# Patient Record
Sex: Female | Born: 1985 | Race: White | Hispanic: No | Marital: Single | Smoking: Former smoker
Health system: Southern US, Community
[De-identification: ages and names within clinical notes are randomized; demographics above are authoritative.]

## PROBLEM LIST (undated history)

## (undated) DIAGNOSIS — F32A Depression, unspecified: Secondary | ICD-10-CM

## (undated) DIAGNOSIS — J45909 Unspecified asthma, uncomplicated: Secondary | ICD-10-CM

## (undated) DIAGNOSIS — F419 Anxiety disorder, unspecified: Secondary | ICD-10-CM

## (undated) DIAGNOSIS — D500 Iron deficiency anemia secondary to blood loss (chronic): Secondary | ICD-10-CM

## (undated) DIAGNOSIS — G2581 Restless legs syndrome: Secondary | ICD-10-CM

## (undated) DIAGNOSIS — R519 Headache, unspecified: Secondary | ICD-10-CM

## (undated) DIAGNOSIS — N920 Excessive and frequent menstruation with regular cycle: Secondary | ICD-10-CM

## (undated) HISTORY — PX: APPENDECTOMY: SHX54

## (undated) HISTORY — DX: Unspecified asthma, uncomplicated: J45.909

## (undated) HISTORY — DX: Anxiety disorder, unspecified: F41.9

## (undated) HISTORY — PX: TUBAL LIGATION: SHX77

---

## 2010-02-14 ENCOUNTER — Encounter: Payer: Self-pay | Admitting: Maternal & Fetal Medicine

## 2010-07-15 ENCOUNTER — Observation Stay: Payer: Self-pay | Admitting: Obstetrics and Gynecology

## 2010-07-23 ENCOUNTER — Emergency Department: Payer: Self-pay | Admitting: Emergency Medicine

## 2010-08-08 ENCOUNTER — Inpatient Hospital Stay: Payer: Self-pay | Admitting: Obstetrics and Gynecology

## 2010-10-06 ENCOUNTER — Emergency Department: Payer: Self-pay | Admitting: Emergency Medicine

## 2010-11-04 ENCOUNTER — Emergency Department: Payer: Self-pay | Admitting: Emergency Medicine

## 2011-06-07 ENCOUNTER — Emergency Department: Payer: Self-pay | Admitting: Emergency Medicine

## 2012-07-20 ENCOUNTER — Emergency Department: Payer: Self-pay | Admitting: Emergency Medicine

## 2012-09-22 ENCOUNTER — Emergency Department: Payer: Self-pay | Admitting: Emergency Medicine

## 2012-09-22 LAB — URINALYSIS, COMPLETE
Ketone: NEGATIVE
Nitrite: NEGATIVE
Ph: 7 (ref 4.5–8.0)
Protein: NEGATIVE
Specific Gravity: 1.005 (ref 1.003–1.030)
WBC UR: 44 /HPF (ref 0–5)

## 2013-01-26 ENCOUNTER — Inpatient Hospital Stay: Payer: Self-pay | Admitting: Obstetrics and Gynecology

## 2013-01-26 LAB — CBC WITH DIFFERENTIAL/PLATELET
Basophil #: 0.1 10*3/uL (ref 0.0–0.1)
Basophil %: 0.7 %
Eosinophil #: 0 10*3/uL (ref 0.0–0.7)
Eosinophil %: 0.2 %
HGB: 12.1 g/dL (ref 12.0–16.0)
Lymphocyte %: 12.5 %
Monocyte #: 0.4 x10 3/mm (ref 0.2–0.9)
Neutrophil #: 8.1 10*3/uL — ABNORMAL HIGH (ref 1.4–6.5)
Platelet: 246 10*3/uL (ref 150–440)
RBC: 4.68 10*6/uL (ref 3.80–5.20)
RDW: 15.1 % — ABNORMAL HIGH (ref 11.5–14.5)
WBC: 9.9 10*3/uL (ref 3.6–11.0)

## 2013-01-26 LAB — COMPREHENSIVE METABOLIC PANEL
Albumin: 3.6 g/dL (ref 3.4–5.0)
Anion Gap: 9 (ref 7–16)
Bilirubin,Total: 0.7 mg/dL (ref 0.2–1.0)
Calcium, Total: 8.4 mg/dL — ABNORMAL LOW (ref 8.5–10.1)
Creatinine: 0.66 mg/dL (ref 0.60–1.30)
EGFR (Non-African Amer.): >60
Glucose: 187 mg/dL — ABNORMAL HIGH (ref 65–99)
Potassium: 3.9 mmol/L (ref 3.5–5.1)
SGOT(AST): 15 U/L (ref 15–37)
SGPT (ALT): 14 U/L (ref 12–78)
Sodium: 133 mmol/L — ABNORMAL LOW (ref 136–145)

## 2013-01-26 LAB — TSH: Thyroid Stimulating Horm: 0.652 u[IU]/mL

## 2013-01-26 LAB — URINALYSIS, COMPLETE
Bilirubin,UR: NEGATIVE
Nitrite: NEGATIVE
Protein: NEGATIVE
Specific Gravity: 1.027 (ref 1.003–1.030)
Squamous Epithelial: 11

## 2013-01-27 LAB — BASIC METABOLIC PANEL
Anion Gap: 3 — ABNORMAL LOW (ref 7–16)
BUN: 5 mg/dL — ABNORMAL LOW (ref 7–18)
Chloride: 109 mmol/L — ABNORMAL HIGH (ref 98–107)
Co2: 27 mmol/L (ref 21–32)
EGFR (African American): >60
EGFR (Non-African Amer.): >60
Glucose: 103 mg/dL — ABNORMAL HIGH (ref 65–99)
Osmolality: 275 (ref 275–301)
Sodium: 139 mmol/L (ref 136–145)

## 2013-01-27 LAB — PHOSPHORUS: Phosphorus: 2.4 mg/dL — ABNORMAL LOW (ref 2.5–4.9)

## 2013-01-27 LAB — MAGNESIUM: Magnesium: 1.6 mg/dL — ABNORMAL LOW

## 2013-01-28 LAB — COMPREHENSIVE METABOLIC PANEL
Albumin: 3.2 g/dL — ABNORMAL LOW (ref 3.4–5.0)
BUN: 2 mg/dL — ABNORMAL LOW (ref 7–18)
Bilirubin,Total: 0.4 mg/dL (ref 0.2–1.0)
Chloride: 109 mmol/L — ABNORMAL HIGH (ref 98–107)
Co2: 25 mmol/L (ref 21–32)
Creatinine: 0.75 mg/dL (ref 0.60–1.30)
EGFR (Non-African Amer.): >60
Glucose: 86 mg/dL (ref 65–99)
Potassium: 3.7 mmol/L (ref 3.5–5.1)
SGPT (ALT): 13 U/L (ref 12–78)
Sodium: 139 mmol/L (ref 136–145)
Total Protein: 6.3 g/dL — ABNORMAL LOW (ref 6.4–8.2)

## 2013-01-28 LAB — CBC WITH DIFFERENTIAL/PLATELET
Basophil #: 0.1 10*3/uL (ref 0.0–0.1)
HCT: 35.1 % (ref 35.0–47.0)
HGB: 11.5 g/dL — ABNORMAL LOW (ref 12.0–16.0)
Lymphocyte %: 18 %
MCH: 25.8 pg — ABNORMAL LOW (ref 26.0–34.0)
MCV: 79 fL — ABNORMAL LOW (ref 80–100)
Monocyte #: 0.5 x10 3/mm (ref 0.2–0.9)
Monocyte %: 8 %
Neutrophil %: 71 %
Platelet: 206 10*3/uL (ref 150–440)
RBC: 4.45 10*6/uL (ref 3.80–5.20)
WBC: 6.7 10*3/uL (ref 3.6–11.0)

## 2013-01-29 LAB — COMPREHENSIVE METABOLIC PANEL
BUN: 4 mg/dL — ABNORMAL LOW (ref 7–18)
Co2: 25 mmol/L (ref 21–32)
EGFR (Non-African Amer.): >60
Glucose: 86 mg/dL (ref 65–99)
Potassium: 4 mmol/L (ref 3.5–5.1)
SGOT(AST): 16 U/L (ref 15–37)
SGPT (ALT): 18 U/L (ref 12–78)
Sodium: 138 mmol/L (ref 136–145)
Total Protein: 6.9 g/dL (ref 6.4–8.2)

## 2013-02-03 LAB — CBC WITH DIFFERENTIAL/PLATELET
Basophil %: 0.6 %
Eosinophil %: 0.3 %
HCT: 38.3 % (ref 35.0–47.0)
HGB: 13 g/dL (ref 12.0–16.0)
Lymphocyte #: 1.3 10*3/uL (ref 1.0–3.6)
MCH: 26.6 pg (ref 26.0–34.0)
MCHC: 34 g/dL (ref 32.0–36.0)
MCV: 78 fL — ABNORMAL LOW (ref 80–100)
Neutrophil #: 8.9 10*3/uL — ABNORMAL HIGH (ref 1.4–6.5)
Platelet: 231 10*3/uL (ref 150–440)
RBC: 4.89 10*6/uL (ref 3.80–5.20)
RDW: 15.3 % — ABNORMAL HIGH (ref 11.5–14.5)
WBC: 11 10*3/uL (ref 3.6–11.0)

## 2013-02-03 LAB — BASIC METABOLIC PANEL
BUN: 7 mg/dL (ref 7–18)
Calcium, Total: 9 mg/dL (ref 8.5–10.1)
Co2: 24 mmol/L (ref 21–32)
Creatinine: 0.74 mg/dL (ref 0.60–1.30)
EGFR (African American): >60
EGFR (Non-African Amer.): >60
Glucose: 151 mg/dL — ABNORMAL HIGH (ref 65–99)
Sodium: 136 mmol/L (ref 136–145)

## 2013-02-04 ENCOUNTER — Inpatient Hospital Stay: Payer: Self-pay | Admitting: Obstetrics and Gynecology

## 2013-02-04 LAB — POTASSIUM: Potassium: 3.9 mmol/L (ref 3.5–5.1)

## 2013-02-04 LAB — MAGNESIUM: Magnesium: 1.7 mg/dL — ABNORMAL LOW

## 2013-02-04 LAB — PHOSPHORUS: Phosphorus: 2.5 mg/dL (ref 2.5–4.9)

## 2013-02-04 LAB — SODIUM: Sodium: 139 mmol/L (ref 136–145)

## 2013-02-05 LAB — BASIC METABOLIC PANEL
Anion Gap: 6 — ABNORMAL LOW (ref 7–16)
BUN: 2 mg/dL — ABNORMAL LOW (ref 7–18)
Calcium, Total: 8.8 mg/dL (ref 8.5–10.1)
Co2: 25 mmol/L (ref 21–32)
EGFR (African American): >60
EGFR (Non-African Amer.): >60
Glucose: 104 mg/dL — ABNORMAL HIGH (ref 65–99)
Osmolality: 274 (ref 275–301)
Sodium: 139 mmol/L (ref 136–145)

## 2013-02-06 LAB — CBC WITH DIFFERENTIAL/PLATELET
Basophil #: 0.1 10*3/uL (ref 0.0–0.1)
Basophil %: 0.7 %
Eosinophil %: 0.2 %
HGB: 10.8 g/dL — ABNORMAL LOW (ref 12.0–16.0)
Lymphocyte #: 2.6 10*3/uL (ref 1.0–3.6)
MCH: 26.2 pg (ref 26.0–34.0)
Monocyte #: 0.8 x10 3/mm (ref 0.2–0.9)
Monocyte %: 7.7 %
Neutrophil #: 6.6 10*3/uL — ABNORMAL HIGH (ref 1.4–6.5)
Neutrophil %: 65.2 %
Platelet: 191 10*3/uL (ref 150–440)
RDW: 15.3 % — ABNORMAL HIGH (ref 11.5–14.5)

## 2013-02-06 LAB — BASIC METABOLIC PANEL
Anion Gap: 5 — ABNORMAL LOW (ref 7–16)
Co2: 27 mmol/L (ref 21–32)
Creatinine: 0.61 mg/dL (ref 0.60–1.30)
EGFR (African American): >60
Glucose: 95 mg/dL (ref 65–99)
Osmolality: 274 (ref 275–301)
Potassium: 3.4 mmol/L — ABNORMAL LOW (ref 3.5–5.1)
Sodium: 139 mmol/L (ref 136–145)

## 2013-02-17 ENCOUNTER — Encounter: Payer: Self-pay | Admitting: Maternal & Fetal Medicine

## 2013-02-19 ENCOUNTER — Emergency Department: Payer: Self-pay | Admitting: Emergency Medicine

## 2013-02-19 LAB — URINALYSIS, COMPLETE
Glucose,UR: NEGATIVE mg/dL (ref 0–75)
Leukocyte Esterase: NEGATIVE
Nitrite: NEGATIVE
Ph: 6 (ref 4.5–8.0)
Protein: NEGATIVE
RBC,UR: 1 /HPF (ref 0–5)
Specific Gravity: 1.013 (ref 1.003–1.030)
Squamous Epithelial: 1
WBC UR: 1 /HPF (ref 0–5)

## 2013-02-19 LAB — HCG, QUANTITATIVE, PREGNANCY: Beta Hcg, Quant.: 129941 m[IU]/mL — ABNORMAL HIGH

## 2013-08-13 ENCOUNTER — Observation Stay: Payer: Self-pay

## 2013-08-25 ENCOUNTER — Ambulatory Visit: Payer: Self-pay | Admitting: Obstetrics and Gynecology

## 2013-08-25 LAB — CBC WITH DIFFERENTIAL/PLATELET
Basophil #: 0.1 10*3/uL (ref 0.0–0.1)
Eosinophil #: 0.1 10*3/uL (ref 0.0–0.7)
Eosinophil %: 0.8 %
HCT: 33.5 % — ABNORMAL LOW (ref 35.0–47.0)
Lymphocyte %: 18 %
MCH: 25 pg — ABNORMAL LOW (ref 26.0–34.0)
MCHC: 32.8 g/dL (ref 32.0–36.0)
Monocyte #: 0.5 x10 3/mm (ref 0.2–0.9)
Neutrophil #: 5.8 10*3/uL (ref 1.4–6.5)
Neutrophil %: 73.3 %
WBC: 7.9 10*3/uL (ref 3.6–11.0)

## 2013-08-26 ENCOUNTER — Inpatient Hospital Stay: Payer: Self-pay | Admitting: Obstetrics and Gynecology

## 2013-08-27 LAB — HEMATOCRIT: HCT: 28.8 % — ABNORMAL LOW (ref 35.0–47.0)

## 2013-08-29 LAB — PATHOLOGY REPORT

## 2014-03-29 ENCOUNTER — Emergency Department: Payer: Self-pay | Admitting: Emergency Medicine

## 2014-06-14 ENCOUNTER — Emergency Department: Payer: Self-pay | Admitting: Student

## 2014-08-12 IMAGING — US US OB NUCHAL TRANSLUCENCY 1ST GEST - [facility] NRPT
1 series · 14 of 28 positions shown · non-contrast
Comparison: none

[Series 1: us ob nuchal translucency 1st gest - [facility] nrpt · 0.19mm/px · 14 of 41 slices shown]
[im 2/41]
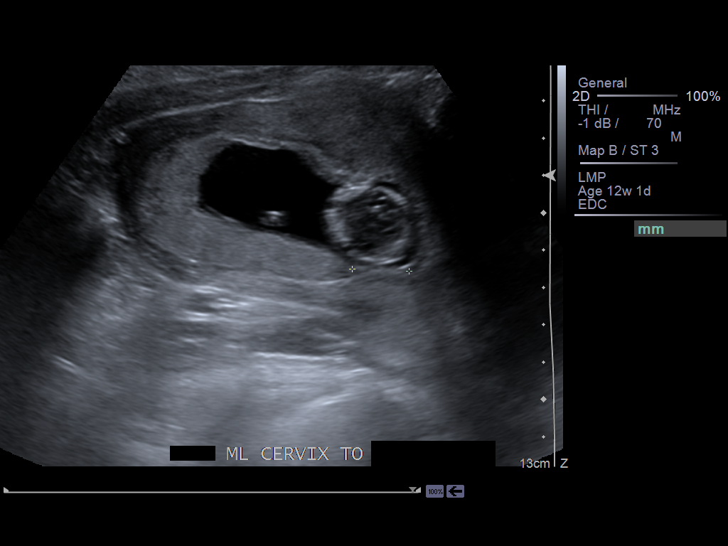
[im 5/41]
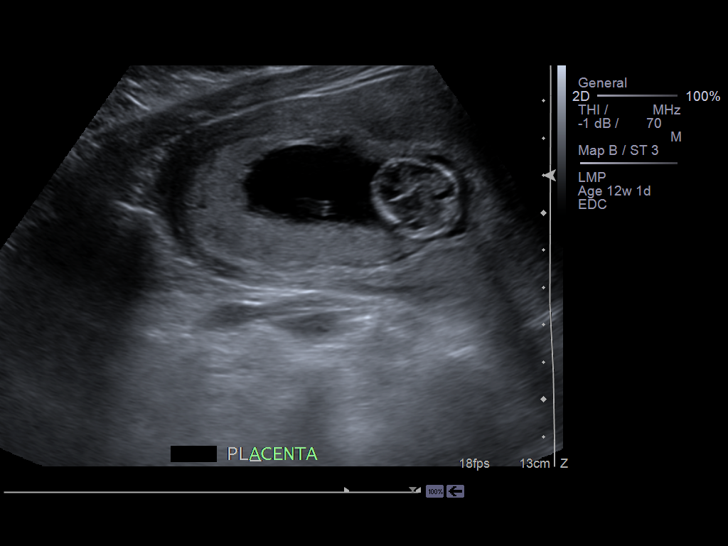
[im 8/41]
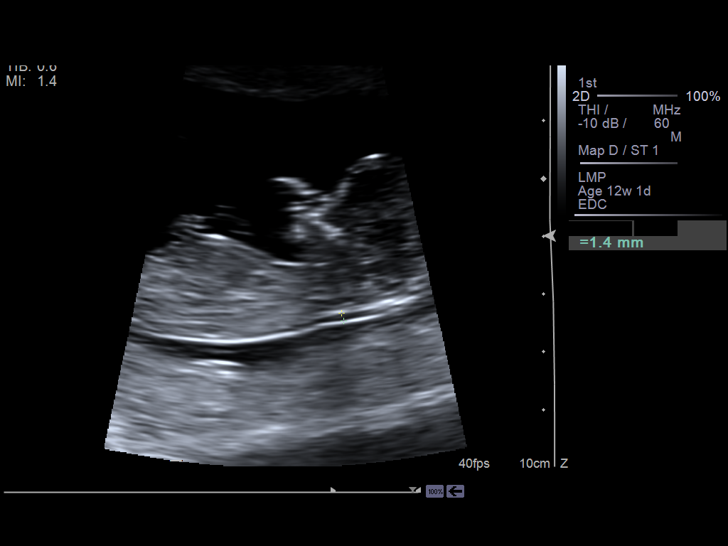
[im 11/41]
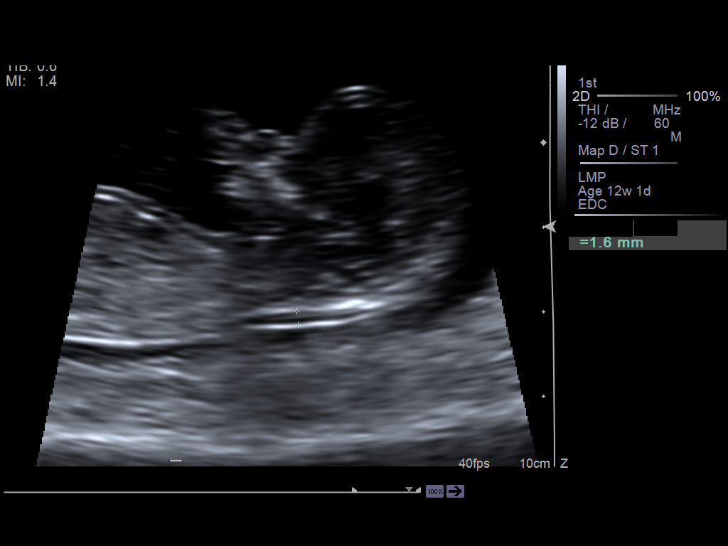
[im 14/41]
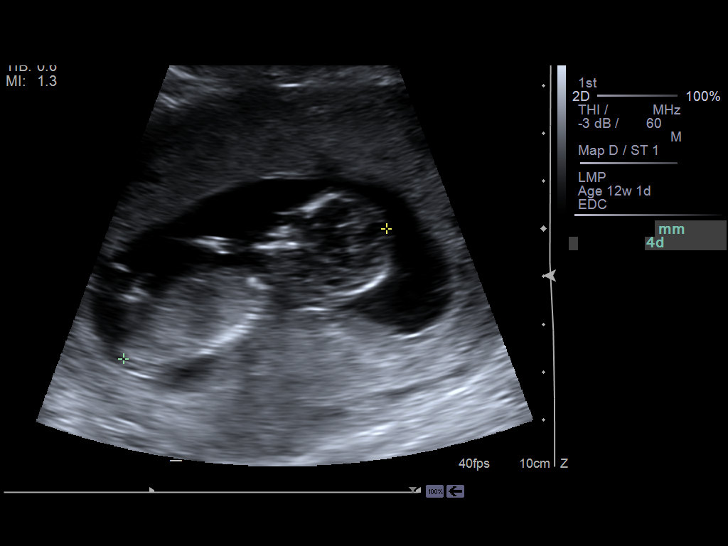
[im 17/41]
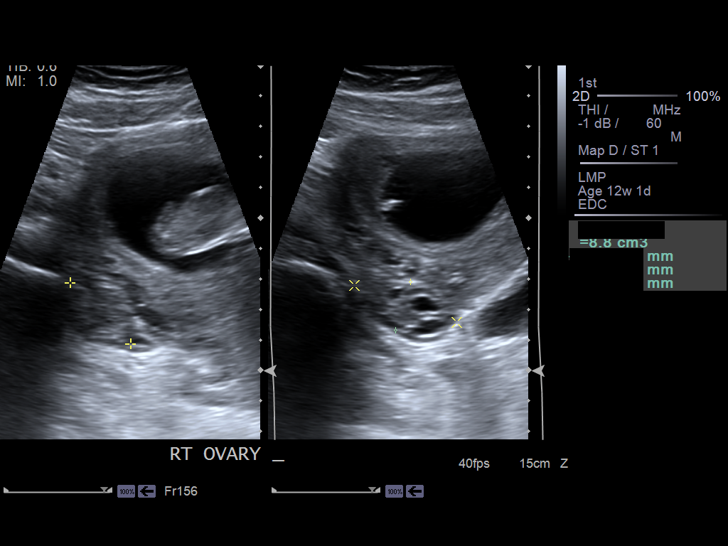
[im 20/41]
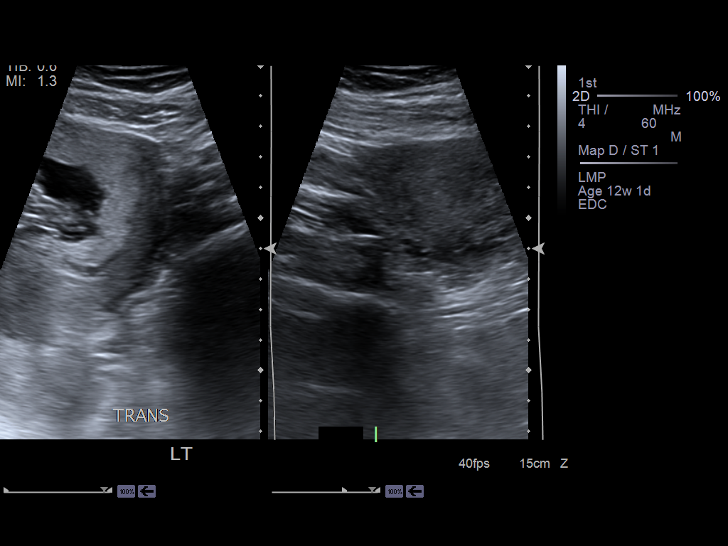
[im 23/41]
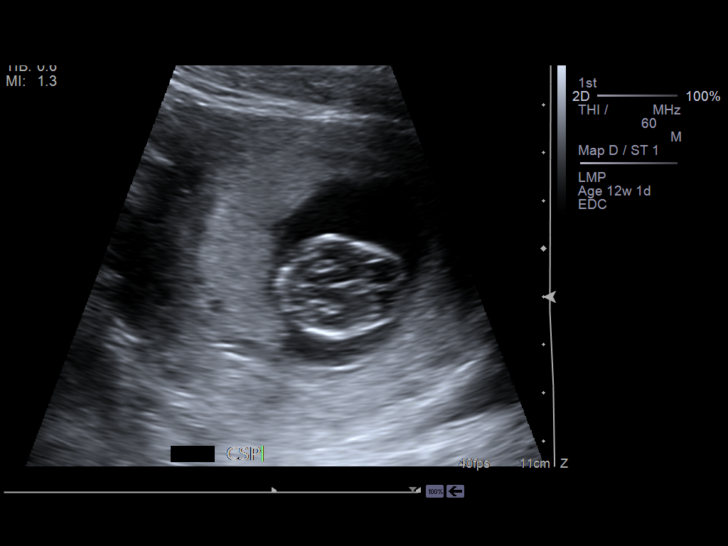
[im 26/41]
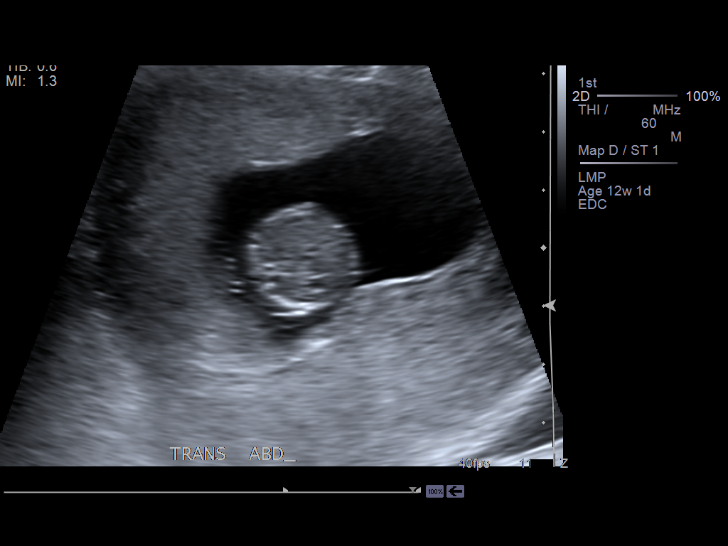
[im 29/41]
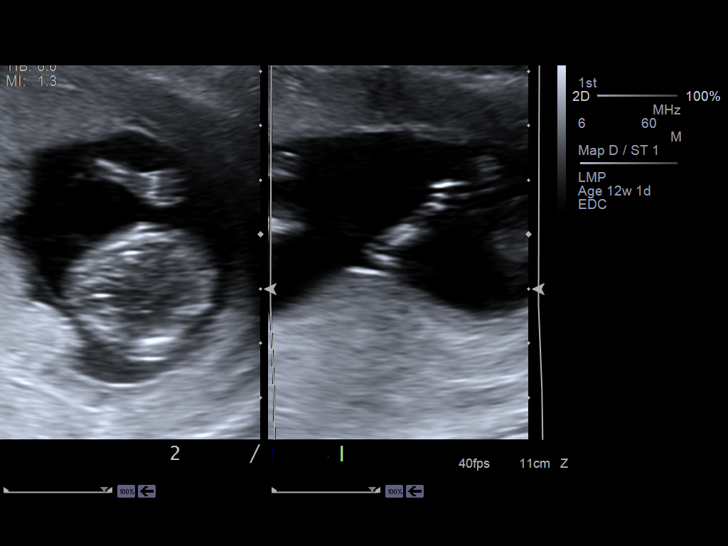
[im 32/41]
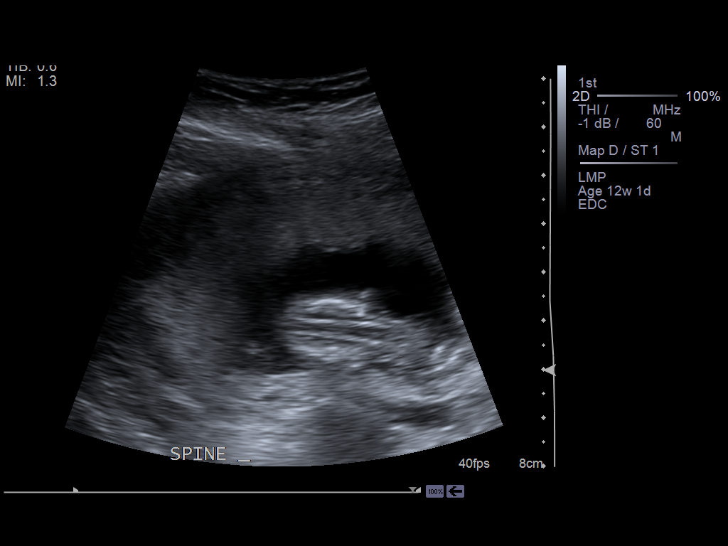
[im 35/41]
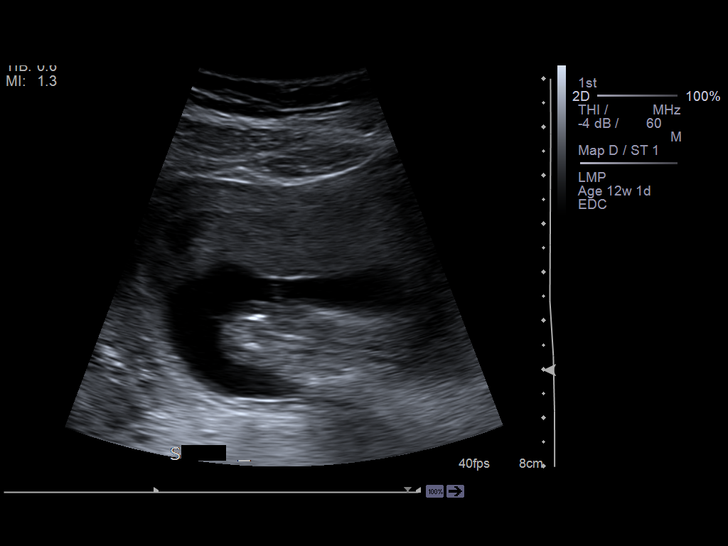
[im 38/41]
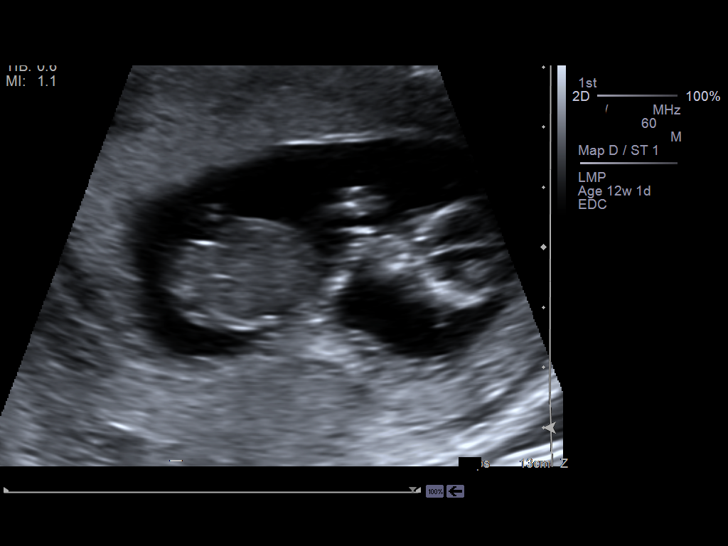
[im 41/41]
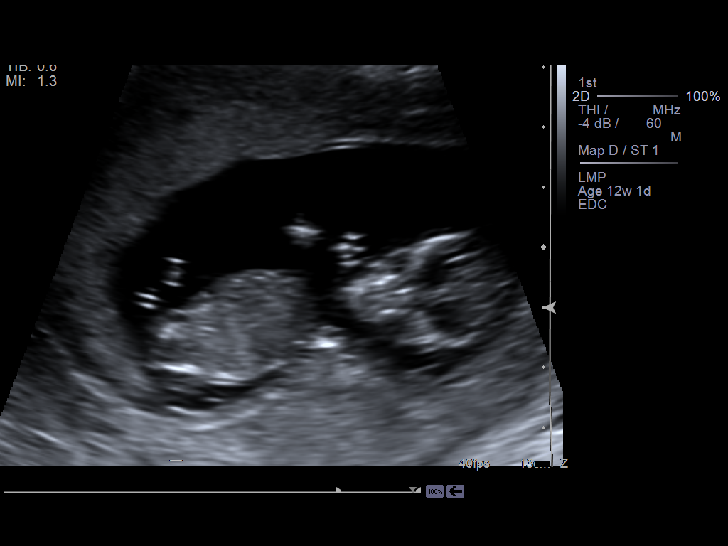

[14 of 28 positions shown; findings below may reference images not displayed]

IMAGES IMPORTED FROM THE [employer] WORKFLOW SYSTEM
NO DICTATION FOR STUDY

## 2014-08-14 IMAGING — US US OB < 14 WEEKS
1 series · 14 of 28 positions shown · non-contrast
Comparison: none

REASON FOR EXAM: suprapubic cramping 12 wks preg LMP 11/24/12
COMMENTS:

PROCEDURE:     US  - US OB LESS THAN 14 WEEKS  - February 19, 2013  [DATE]
RESULT:     Limited first trimester prenatal ultrasound dated 02/19/2013

[Series 1: us ob < 14 weeks · 0.25mm/px · 14 of 36 slices shown]
[im 2/36]
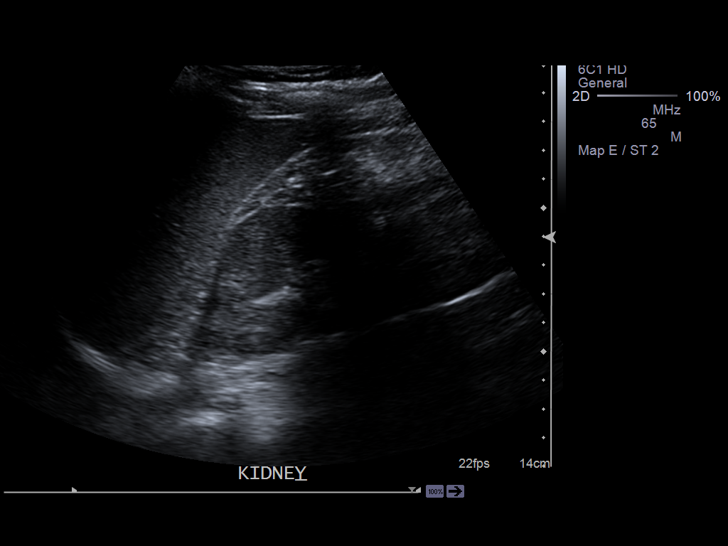
[im 4/36]
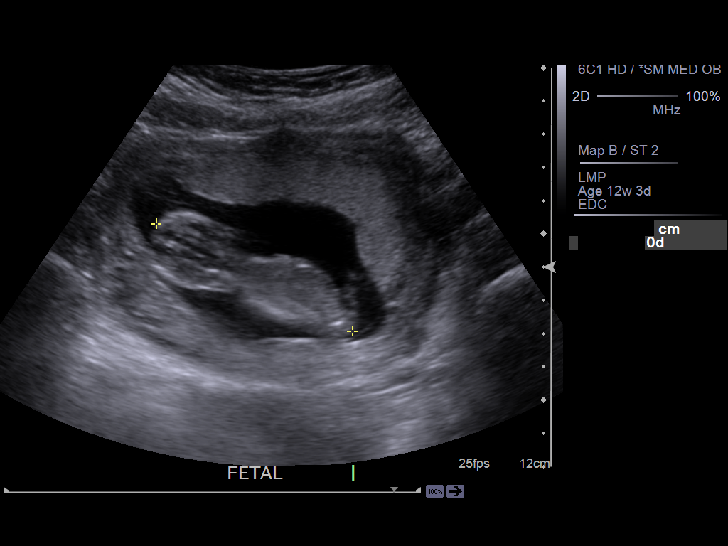
[im 7/36]
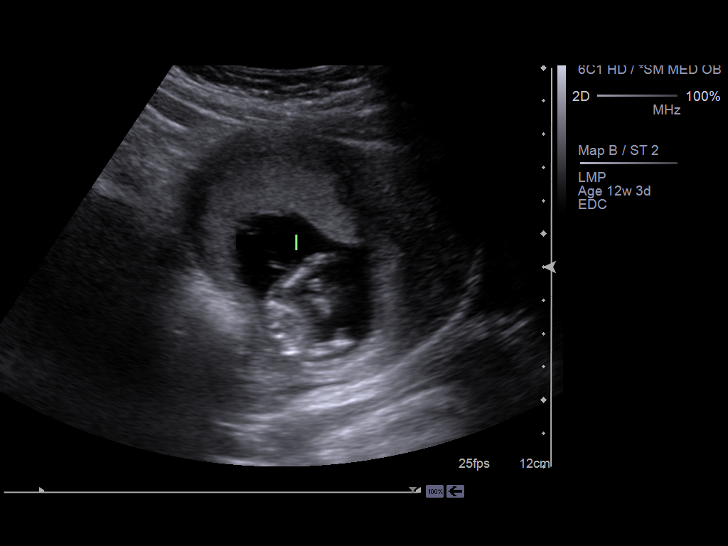
[im 10/36]
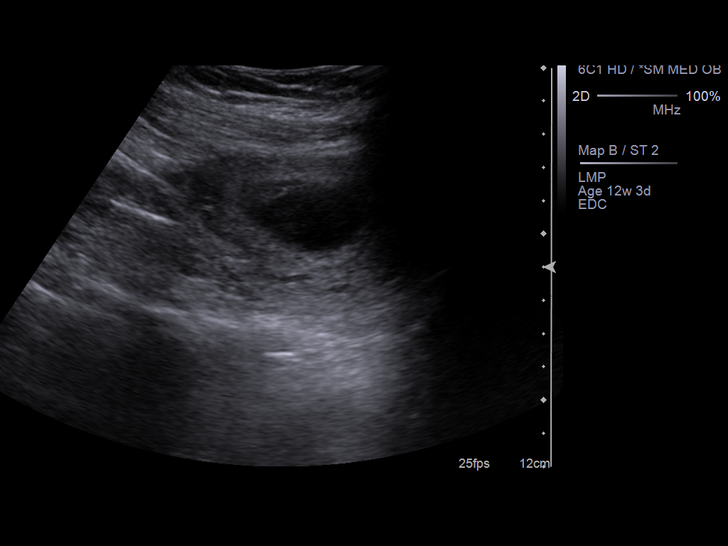
[im 12/36]
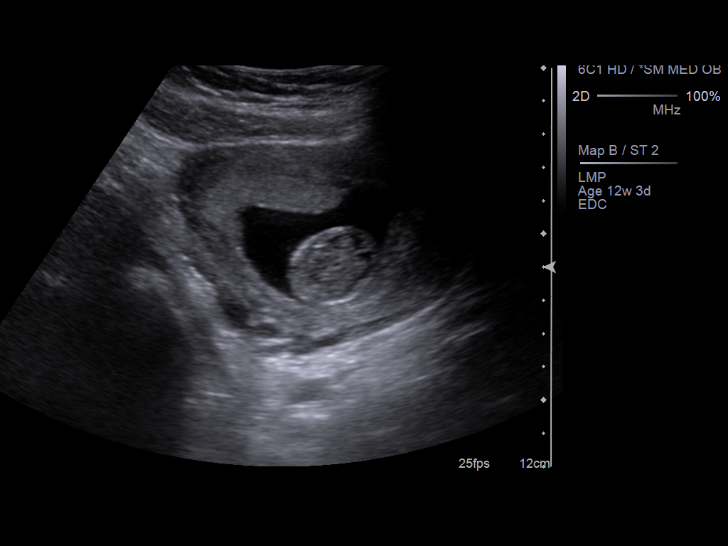
[im 15/36]
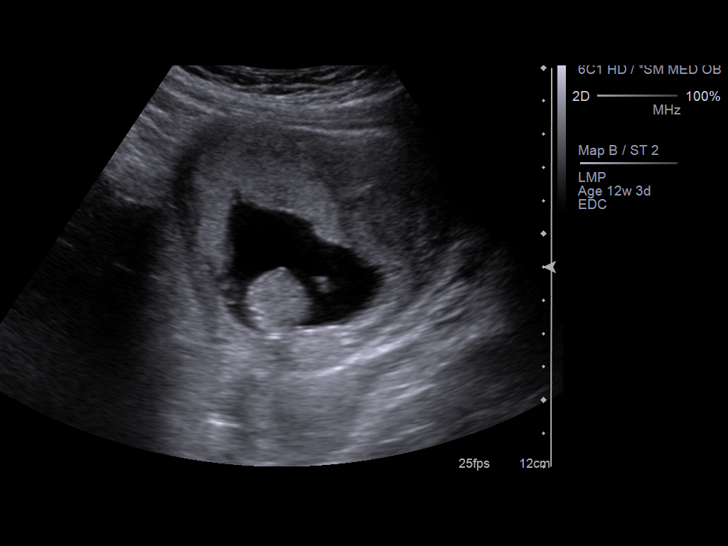
[im 17/36]
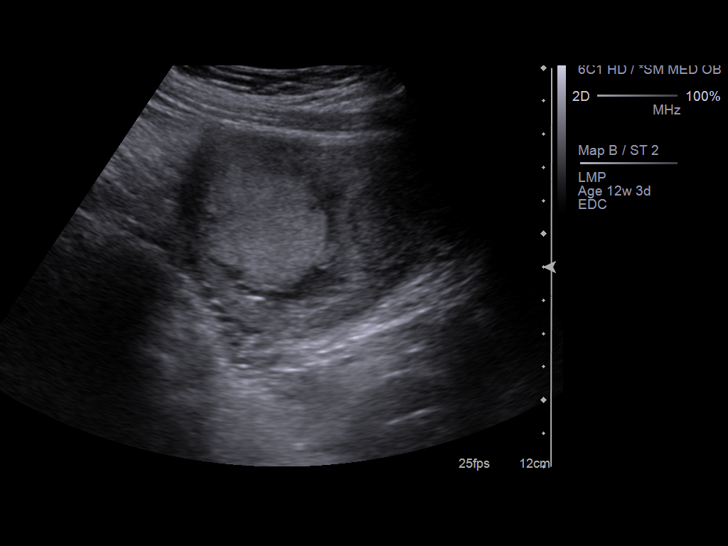
[im 20/36]
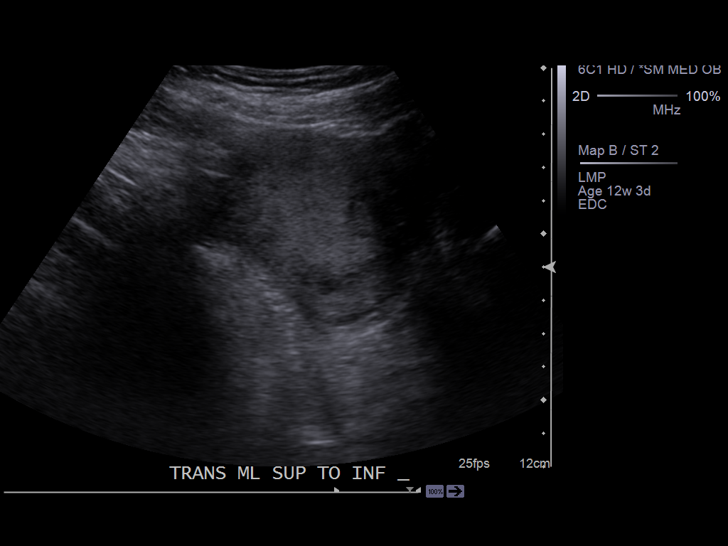
[im 23/36]
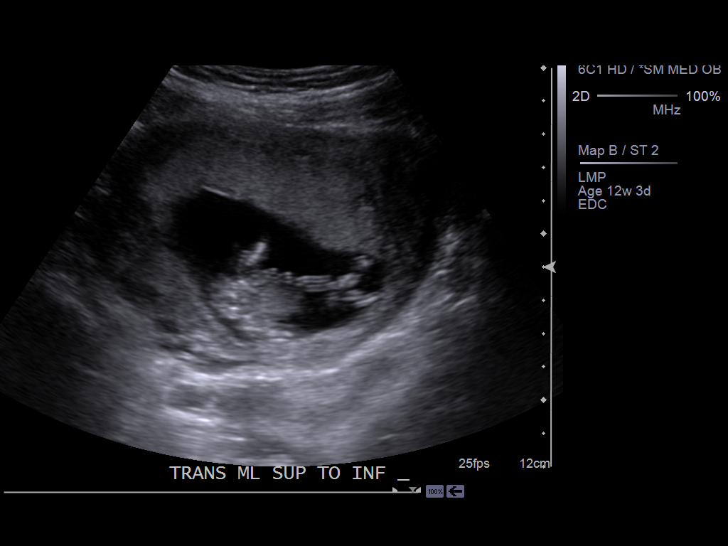
[im 25/36]
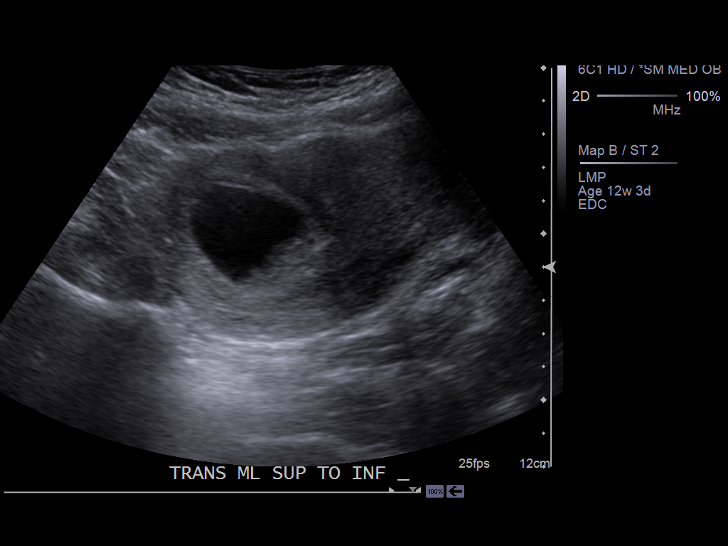
[im 28/36]
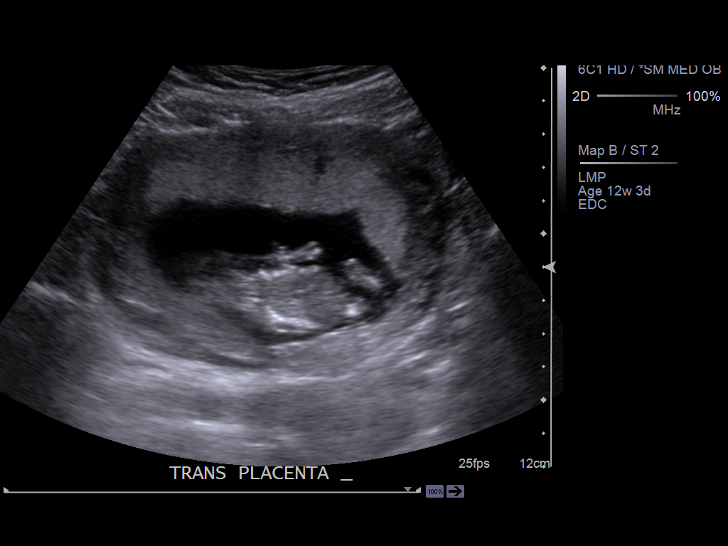
[im 30/36]
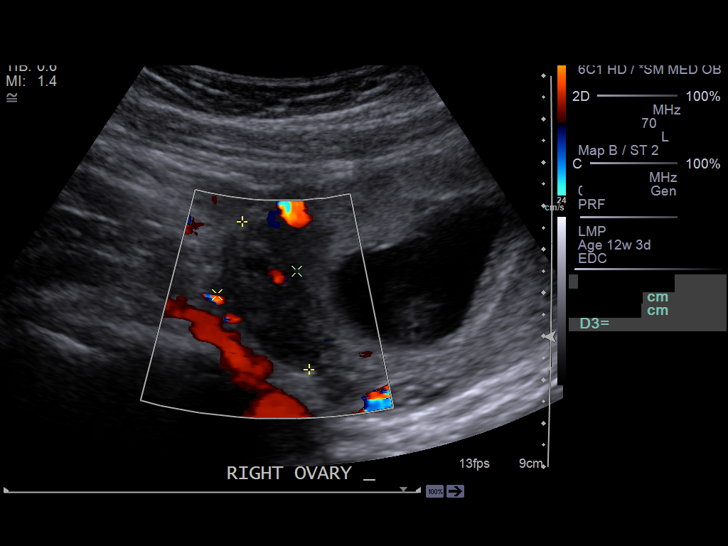
[im 33/36]
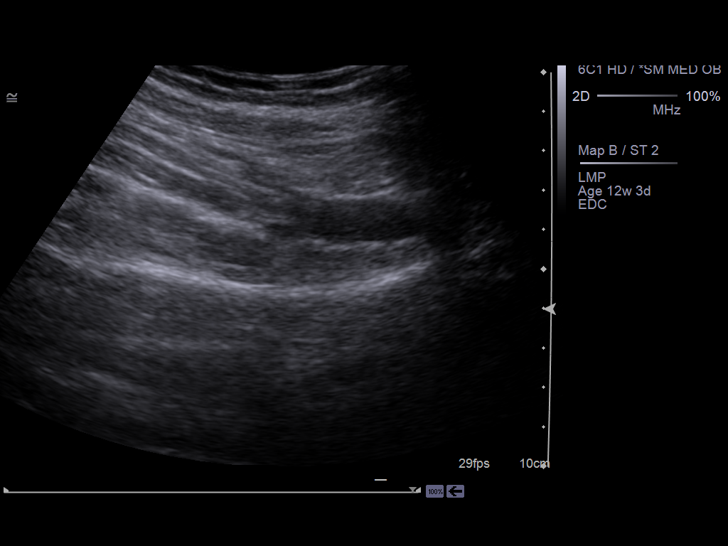
[im 36/36]
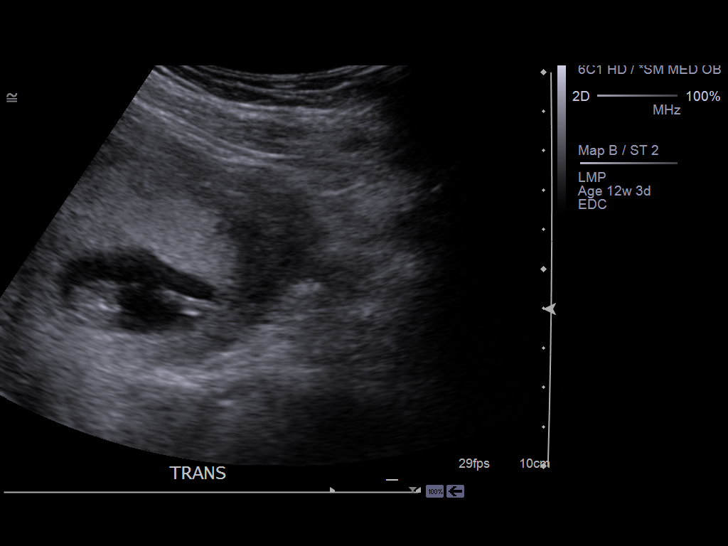

[14 of 28 positions shown; findings below may reference images not displayed]

FINDINGS: A single viable intrauterine pregnancy is appreciated with a
gestational age of 13 weeks 0 days based upon a crown-rump length of
cm. The yolk sac is not visualized. An area likely representing the placenta
is appreciated and appears unremarkable. The uterus is otherwise
unremarkable. The adnexal regions are unremarkable. The left ovary is not
visualized. The right ovary measures 3.73 x 1.91 x 1.38 cm. Color filling of
vessel is identified within the right ovary. There is no evidence of adnexal
masses free fluid per loculated fluid collections.
IMPRESSION: Single viable intrauterine pregnancy as described above.

## 2015-02-02 NOTE — Op Note (Signed)
PATIENT NAME:  Marilyn Stanton, Marilyn Stanton MR#:  [id number] DATE OF BIRTH:  04-08-1986  DATE OF PROCEDURE:  08/26/2013  PREOPERATIVE DIAGNOSES:  1.  For repeat cesarean section.  2.  Desires permanent sterilization. 3.  Term pregnancy.   POSTOPERATIVE DIAGNOSES: 1.  For repeat cesarean section.  2.  Desires permanent sterilization. 3.  Term pregnancy.   PROCEDURE: Repeat cesarean section,  bilateral tubal ligation and on-Q pump placement.   SURGEON: Ricky L. Logan BoresEvans, M.D.   ASSISTANMarland Kitchen: Jacki ConesLaurie (scrub).   ESTIMATED BLOOD LOSS: 700 mL.   COMPLICATIONS: None.   DRAINS: Foley.   SPECIMENS: Portion of bilateral fallopian tube. Again, On-Q pain pump was placed.   PROCEDURE IN DETAIL: After receiving spinal and being placed in the supine position, prepped and draped and Foley catheter placement and IV Ancef given, she was prepped and draped in the usual sterile fashion. After assuring adequate anesthesia, a #10 blade was used to create a Pfannenstiel incision through old scar and repeat low transverse cesarean section was carried out in the usual fashion without difficulty with chromic running interlocking on the uterus.   Tubal ligation was carried out in the usual fashion. The right fallopian tube was grasped in a relatively avascular region in the mid ampullary portion. A knuckle of tube was created, double ligated with 0 plain and the knuckle of tube was excised. The stumps were cauterized and observed for hemostasis. We proceeded in a similar fashion on the left. The uterus was returned to abdominally cavity. The incision sites x 3 were inspected for hemostasis and seen to be excellent. Areas were irrigated. The rectus muscles seemed to be hemostatic. Fascia was grasped with Kochers and elevated and the 2 On-Q pump catheters were placed. Coil was then placed and deviated toward the left on the already closed portion of the fascia. Closure was then finished with a simple running. Subcutaneous was made  hemostatic with cautery. The skin was closed with absorbable clips and On-Q catheters were fixed in place with skin glue, Steri-Strips,labeled Tegaderm. The patient tolerated the procedure well. I anticipate a routine postoperative course.   ____________________________ Reatha Harpsicky L. Logan BoresEvans, MD rle:aw D: 08/26/2013 08:24:56 ET T: 08/26/2013 09:56:18 ET JOB#: [id number]  cc: Clide Clifficky L. Logan BoresEvans, MD, <Dictator> Augustina MoodICK L Iracema Lanagan MD ELECTRONICALLY SIGNED 08/27/2013 19:51

## 2015-02-02 NOTE — Consult Note (Signed)
Details:   - Nutritional Follow-up: Food intake: eating orange sherbet during visit.  Wanting solid foods this am. No nausea or pain last shift Diet order: full liquids Medications: D5 LR at 14450ml/hr (provides 612 kcals/d), vit B added, continues on sterod taper. Anthropometric: wt on admission 133 pounds, 138 pounds, 137 today. Urine output: 3100ml last shift Glucose profile: glucose 104  Monitoring and evaluation: 1. Energy intake: goal would be for pt to tolerate solid foods 2. Electrolyte and renal profile 3. Glucose profile  Intervention: 1. Meals and snacks: Cater to pt prefences.  MD planning on advancing diet to regular for lunch today, spoke with RN, Eunice Blaseebbie.  Spoke with pt and got lunch order for today (lowfat, bland foods encouraged).  Now that pt in on solid foods will be getting menu to complete and encouraged lowfat, bland foods for better tolerance.  2. Supplement: Agree with  ensure ordered.  Pt willing to try between meals as encouraged intake q 3 hrs.  If unable to tolerate ensure recommend snacks between meals for added nutrition as on last admission unable to tolerate carnation instant breakfast supplement.   Pt is at level 3.   Electronic Signatures: Harl FavorAllen, Joli Bailey (RD)  (Signed 26-Apr-14 11:45)  Authored: Details   Last Updated: 26-Apr-14 11:45 by Harl FavorAllen, Joli Bailey (RD)

## 2015-02-02 NOTE — Discharge Summary (Signed)
Dates of Admission and Diagnosis:  Date of Admission 26-Jan-2013   Date of Discharge 30-Jan-2013   Admitting Diagnosis hyperemesis   Final Diagnosis hyperemesis    Chief Complaint/History of Present Illness Hyperemesis with weight loss   Hospital Course:  Hospital Course No emesis and tolerating bland with liquids and HD#4 Weight stable Chemistries largely normalized   Condition on Discharge Good   DISCHARGE INSTRUCTIONS HOME MEDS:  Medication Reconciliation: Patient's Home Medications at Discharge:     Physician's Instructions:  Home Health? No   Treatments None   Home Oxygen? No   Diet bland   Activity Limitations As tolerated   Return to Work 2 weeks   Time frame for Follow Up Appointment 1-2 weeks   Electronic Signatures: Margaretha GlassingEvans, Ricky L (MD)  (Signed 20-Apr-14 10:29)  Authored: ADMISSION DATE AND DIAGNOSIS, CHIEF COMPLAINT/HPI, HOSPITAL COURSE, DISCHARGE INSTRUCTIONS HOME MEDS, PATIENT INSTRUCTIONS   Last Updated: 20-Apr-14 10:29 by Margaretha GlassingEvans, Ricky L (MD)

## 2015-02-02 NOTE — Consult Note (Signed)
Details:   - Nutritional Follow-up: EN: Noted NG tube placed but then pulled out and pt refused replacement of NG tube.  Medications: D5 LR at 1725ml/hr (provides 510 kcals/d), NS with MVI mag, thiamine, folate, Vit b6 added, zofran and compazine Glucose profile: glucose 103 Electrolyte and renal profile: BUN 5, creatine WDL, Ca 8.3, phosphorus 2.4, Mag 1.6 Protein profile: albumin 3.6 Diet order: clear liquids this am then progressed to full liquids for lunch Food intake: Pt reports ate jello and popscile for breakfast this am, for lunch took few bites of cream of potato soup, ate all of pudding, sipping on carnation instant breakfast supplement and apple juice, eating crackers Anthropometric: correction in calculation regarding pt with 6% weight loss in the last 2 weeks prior to admission. Noted increase in weight by 2 pounds this am Digestive system: reporting abdominal cramping and 1 episode of diarrhea this pm.  Monitoring and evaluation: 1. Energy intake: goal would be for pt to tolerate liquids and diet progressed to bland, lowfat diet 2. Digestive system 3. Electrolyte and renal profile 4. Anthropometric:    Intervention: 1. Meals and snacks: Discussed importance of lowfat, bland diet with pt.  Order placed for lunch, dinner and breakfast tomorrow am.  Encourage intake of food q 3 hr  (small frequent meals) to decrease nausea, vomiting. Pt verbalized understanding. 2. Supplement: will add carnation instant breakfast TID between meals and encourage pt to sip between meals.   Pt is level 4.   Electronic Signatures: Harl FavorAllen, Joli Bailey (RD)  (Signed 17-Apr-14 15:52)  Authored: Details   Last Updated: 17-Apr-14 15:52 by Harl FavorAllen, Joli Bailey (RD)

## 2015-02-02 NOTE — Consult Note (Signed)
Details:   - Nutritional Follow-up: Food intake: nauseated for supper last night and unable to eat ate crackers and gingerale, eating few bites of popsicle this am, 100% of orange sherbet, few bites of yogurt Medications: D5 12325ml/hr continues, MVI, Vit B 6, folic acid, mag, thamine, zofran Urine output: 1550ml output Anthropometric: wt of 140.6  PES: Inadequate food and beverage intake slowly improving  Monitoring and evaluation: 1. Energy intake: goal would be for pt to tolerate liquids and diet progressed to bland, lowfat diet 2. Digestive system 3. Electrolyte and renal profile 4. Anthropometric:   Intervention: 1. Meals and snacks: Materials given regarding lowfat, bland diet with small frequent meals.  Pt verbalized understanding. 2. Supplement: will continue carnation instant breakfast TID between meals and encourage pt to sip between meals.  3. Coordination of care: This Geographical information systems officerwriter and Pharmacist Crystal spoke with Dr. Logan BoresEvans (covering today) and discussed if this pt would benefit from medrol taper to ease nausea. MD to order if feels appropriate for this pt.    Pt is level 4.   Electronic Signatures: Harl FavorAllen, Joli Bailey (RD)  (Signed 18-Apr-14 09:54)  Authored: Details   Last Updated: 18-Apr-14 09:54 by Harl FavorAllen, Joli Bailey (RD)

## 2015-02-02 NOTE — Consult Note (Signed)
Details:   - Nutritional Assessment: RD consulted regarding [redacted] weeks pregnant with severe weight loss Medical therapy: Pt admitted with hyperemesis, [redacted] weeks pregnant Anthropometric: No ht or wt at this time, pt reports ht of 66 inches and prepregnancy weight of 145 pounds, reports wt on 4/1 of 147 pounds and wt yesterday of 138.  (9% weight loss in the last 2 weeks) Glucose profile: glucose 187 Electrolyte and renal profile: Na 133 Protein profile: albumin 3.6 Medications: D5 LR at 13625ml/hr (provides 510 kcals/d), zofran, compazine Food intake: reports poor po intake for the past 2 weeks eating 50% less than what normally would eat. For the past 3 days extremely low appetite with vomiting and nausea  Estimated energy needs: Using pre-pregnancy weight of 66kg.  (30 kcals/kg) 1980 kcals/d. Estimated protein needs 73-79 gm/d. Estimated fluid needs (35-3840ml/kg) 2310-264740ml/d  PES: Inadequate food and beverage intake related to altered GI function as evidenced by nausea, vomiting, 9% weight loss in 15 days)  Monitoring and evaluation: 1. Energy intake: tolerating and titration of tube feeding 2. Electrolyte and renal profile 3. Protein profile 4. Glucose profile  Intervention: 1. EN: Recommend jevity 1.5 at goal rate of 6145ml/hr, continous via NG tube .  Will provide 1620 kcals, 69 gm of protein and 820ml free water (82% of kcals needs).  Recommend starting at 7615ml/hr today.  Will plan to reassess tolerance in am and if tolerating will titrate tube feeding by 15ml q 12 hr until goal reached.  Discussed with MD and midwife Deatra RobinsonKaren Jones.  Midwife to speak with pt regarding tube placement. 2. Other: Recommend BMP and Phos and mag in am as pt at risk for refeeding syndrome.  Pt is at level 4.   Electronic Signatures: Harl FavorAllen, Joli Bailey (RD)  (Signed 16-Apr-14 15:55)  Authored: Details   Last Updated: 16-Apr-14 15:55 by Harl FavorAllen, Joli Bailey (RD)

## 2015-02-02 NOTE — Discharge Summary (Signed)
PATIENT NAME:  Marilyn Stanton, Marilyn Stanton MR#:  [id number] DATE OF BIRTH:  04/06/1986  DATE OF ADMISSION:  02/04/2013 DATE OF DISCHARGE: 02/06/2013   ADMISSION DIAGNOSIS: Hyperemesis, [redacted] weeks pregnant.   HOSPITAL COURSE: The patient was admitted to [facility] and was started on IV fluids, IV Zofran and B6 50 mg twice a day. The patient was advanced to a regular diet on hospital day #3 and had no nausea and no vomiting. The patient was started on a prednisone taper 40 mg decrease by 10 mg a day. The patient is discharged to home in good condition. [facility].   DISCHARGE MEDICATIONS: B6 50 mg twice a day and Zofran 8 mg q, 6 hours as needed.   ____________________________ Suzy Bouchardhomas J. Bing Duffey, MD tjs:aw D: 02/06/2013 09:55:00 ET T: 02/06/2013 10:05:59 ET JOB#: [id number]  cc: Suzy Bouchardhomas J. Nikie Cid, MD, <Dictator> Suzy BouchardHOMAS J Joleah Kosak MD ELECTRONICALLY SIGNED 02/08/2013 12:07

## 2015-02-02 NOTE — Consult Note (Signed)
Details:   - Nutrition Follow-up: Diet Order: [place] Energy Intake: pt reports the [employer] Instant Breakfast supplement does not "sit well" with her, not drinking; eating bites of soup this AM; noted bites recorded yesterday; pt "tired" of current diet, asking for applesauce Digestive System: +nausea yesterday but no vomitting Anthropometrics: weight 138.6 pounds today, 140.6 pounds yesterday Meds: D5-LR at 25 ml/hr, IV MVI  PES: Inadequate food and beverage continues but being addressed as d/c supplement and adding snacks per pt request  Monitoring/Evaluation: 1) Energy Intake: diet tolerance, progression 2) Digestive System  Interventions: 1) Meals/Snacks: continue to cater to pt preferences; obtained menu for lunch today. Pt asking for applesauce, not compliant with current diet order; pt may benefit from addition of a few bland food items like applesauce, plain mashed potatoes, toast to current diet order.  2) Medical Food supplement: recommend discontinuing [employer] Instant Breakfast as pt not drinking; will send snacks between meals, pt not wanting Ensure type supplement at this time, but may benefit from trying if pt willing  Pt is a Level 4 follow   Electronic Signatures: Romelle Starcherhodes, Cate (RD)  (Signed 19-Apr-14 10:46)  Authored: Details   Last Updated: 19-Apr-14 10:46 by Romelle Starcherhodes, Cate (RD)

## 2015-02-20 NOTE — H&P (Signed)
L&D Evaluation:  History:  HPI No records available. Pt is 10 1/7 weeks with hyperemesis and total wt loss of 12 pounds in last 1-2 weeks. Pt was admitted on 01/27/13 and NG tube attempted but, then pt refused to try again. Pt was hydrated and then released home. Pt ate chicken soup last pm, toast and applesauce for lunch and kept cl liqs down but, today has not been able to keep anything down. +Nausea, no emesis noted. Pt is also having anxiety attacks and crying while attempting to place Dobhoff tube. Pt refused after using Cetacaine, 4%xylocaine jelly and 4 mg IV morphine sulfate. Pt crying and asking for a PIC line. ADvised that nutrionist does not want to use a PiC line due to liver toxicity and other metabolic complications. pt is adamantly refusing insertion of any NG tube. ADvised pt that IV hydration is short term.   Presents with hyperemesis gravidarum   Patient's Medical History Anxiety   Patient's Surgical History none   Medications Pre Natal Vitamins   Allergies NKDA   Social History none   Family History Non-Contributory   ROS:  ROS All systems were reviewed.  HEENT, CNS, GI, GU, Respiratory, CV, Renal and Musculoskeletal systems were found to be normal.   Exam:  Vital Signs stable   General no apparent distress   Mental Status clear   Chest clear   Heart normal sinus rhythm, no murmur/gallop/rubs   Back no CVAT   Edema no edema   Skin dry   Impression:  Impression dehydration   Plan:  Plan Hydration   Comments Pt had accepted plan of care in the office regarding Dobhoff tube for TF which is calories but, now is refusing. PIC Line is not what nutrition wants for this pt.   Advised pt that IV will only hydrate and will not add nutrition nor calories. Partner is EMT and wants IM Zofran for pt so that he can use it to give her injections at home to avoid hospitalization.  Dr. Feliberto GottronSchermerhorn aware of pt refusal to do the Dobhoff and he wants nutrition to see  pt in am. In meantime, will run IV fluids and Zofran (9:30pm)   Electronic Signatures: Sharee PimpleJones, Caron W (CNM)  (Signed 24-Apr-14 23:02)  Authored: L&D Evaluation   Last Updated: 24-Apr-14 23:02 by Sharee PimpleJones, Caron W (CNM)

## 2016-07-09 ENCOUNTER — Emergency Department
Admission: EM | Admit: 2016-07-09 | Discharge: 2016-07-09 | Disposition: A | Discharge status: Home / Self Care | Payer: 59 | Attending: Emergency Medicine | Admitting: Emergency Medicine

## 2016-07-09 ENCOUNTER — Emergency Department: Payer: 59

## 2016-07-09 ENCOUNTER — Encounter: Payer: Self-pay | Admitting: Emergency Medicine

## 2016-07-09 DIAGNOSIS — R110 Nausea: Secondary | ICD-10-CM

## 2016-07-09 DIAGNOSIS — N946 Dysmenorrhea, unspecified: Secondary | ICD-10-CM | POA: Insufficient documentation

## 2016-07-09 DIAGNOSIS — R1030 Lower abdominal pain, unspecified: Secondary | ICD-10-CM | POA: Diagnosis present

## 2016-07-09 DIAGNOSIS — R102 Pelvic and perineal pain: Secondary | ICD-10-CM

## 2016-07-09 LAB — COMPREHENSIVE METABOLIC PANEL
ALT: 17 U/L (ref 14–54)
AST: 21 U/L (ref 15–41)
Albumin: 4.2 g/dL (ref 3.5–5.0)
Alkaline Phosphatase: 45 U/L (ref 38–126)
Anion gap: 4 — ABNORMAL LOW (ref 5–15)
BILIRUBIN TOTAL: 0.6 mg/dL (ref 0.3–1.2)
BUN: 11 mg/dL (ref 6–20)
CHLORIDE: 108 mmol/L (ref 101–111)
CO2: 27 mmol/L (ref 22–32)
CREATININE: 0.9 mg/dL (ref 0.44–1.00)
Calcium: 9.1 mg/dL (ref 8.9–10.3)
Glucose, Bld: 112 mg/dL — ABNORMAL HIGH (ref 65–99)
POTASSIUM: 4.9 mmol/L (ref 3.5–5.1)
Sodium: 139 mmol/L (ref 135–145)
TOTAL PROTEIN: 7.2 g/dL (ref 6.5–8.1)

## 2016-07-09 LAB — LIPASE, BLOOD: LIPASE: 26 U/L (ref 11–51)

## 2016-07-09 LAB — URINALYSIS COMPLETE WITH MICROSCOPIC (ARMC ONLY)
BILIRUBIN URINE: NEGATIVE
Bacteria, UA: NONE SEEN
GLUCOSE, UA: NEGATIVE mg/dL
KETONES UR: NEGATIVE mg/dL
LEUKOCYTES UA: NEGATIVE
NITRITE: NEGATIVE
PH: 6 (ref 5.0–8.0)
Protein, ur: NEGATIVE mg/dL
SPECIFIC GRAVITY, URINE: 1.019 (ref 1.005–1.030)

## 2016-07-09 LAB — CBC
HCT: 36.5 % (ref 35.0–47.0)
Hemoglobin: 11.9 g/dL — ABNORMAL LOW (ref 12.0–16.0)
MCH: 23.5 pg — ABNORMAL LOW (ref 26.0–34.0)
MCHC: 32.6 g/dL (ref 32.0–36.0)
MCV: 72.1 fL — AB (ref 80.0–100.0)
PLATELETS: 254 10*3/uL (ref 150–440)
RBC: 5.06 MIL/uL (ref 3.80–5.20)
RDW: 16.5 % — ABNORMAL HIGH (ref 11.5–14.5)
WBC: 11.1 10*3/uL — AB (ref 3.6–11.0)

## 2016-07-09 LAB — CHLAMYDIA/NGC RT PCR (ARMC ONLY)
CHLAMYDIA TR: NOT DETECTED
N gonorrhoeae: NOT DETECTED

## 2016-07-09 LAB — POCT PREGNANCY, URINE: Preg Test, Ur: NEGATIVE

## 2016-07-09 MED ORDER — OXYCODONE-ACETAMINOPHEN 5-325 MG PO TABS: 2.0000 | ORAL_TABLET | Freq: Four times a day (QID) | ORAL | 0 refills | Status: DC | PRN

## 2016-07-09 NOTE — ED Notes (Signed)
Blood collected right ac

## 2016-07-09 NOTE — ED Notes (Signed)
Pt reports that she awoke at 9am with severe lower abd pain and nausea - denies vomiting - one loose stool in 24 hours - pt reports last week that she had pain with urination - MD at bedside assessing pt

## 2016-07-09 NOTE — ED Notes (Addendum)
Per provider no need for IV access - pelvic cart at bedside

## 2016-07-09 NOTE — ED Provider Notes (Signed)
[facility] Emergency Department Provider Note        Time seen: ----------------------------------------- 12:24 PM on 07/09/2016 -----------------------------------------    I have reviewed the triage vital signs and the nursing notes.   HISTORY  Chief Complaint Abdominal Pain    HPI Marilyn Stanton is a 30 y.o. female who presents to ER for lower abdominal pain with nausea. She's not had vomiting or diarrhea. Patient had an appendectomy years ago as well as tubal ligation. Patient denies any radiating pain, states pain began one hour ago after awakening. Patient is currently on her menstrual cycle, nothing makes the pain better or worse.   History reviewed. No pertinent past medical history.  There are no active problems to display for this patient.   Past Surgical History:  Procedure Laterality Date  . APPENDECTOMY    . TUBAL LIGATION      Allergies Review of patient's allergies indicates no known allergies.  Social History Social History  Substance Use Topics  . Smoking status: Not on file  . Smokeless tobacco: Not on file  . Alcohol use Not on file    Review of Systems Constitutional: Negative for fever. Cardiovascular: Negative for chest pain. Respiratory: Negative for shortness of breath. Gastrointestinal:Positive for abdominal pain Genitourinary: Negative for dysuria. positive for vaginal bleeding Musculoskeletal: Negative for back pain. Skin: Negative for rash. Neurological: Negative for headaches, focal weakness or numbness.  10-point ROS otherwise negative.  ____________________________________________   PHYSICAL EXAM:  VITAL SIGNS: ED Triage Vitals  Enc Vitals Group     BP 07/09/16 1119 123/77     Pulse Rate 07/09/16 1119 81     Resp 07/09/16 1119 16     Temp 07/09/16 1119 98.4 F (36.9 C)     Temp Source 07/09/16 1119 Oral     SpO2 07/09/16 1057 98 %     Weight 07/09/16 1119 155 lb (70.3 kg)     Height  07/09/16 1119 5\' 6"  (1.676 m)     Head Circumference --      Peak Flow --      Pain Score --      Pain Loc --      Pain Edu? --      Excl. in GC? --     Constitutional: Alert and oriented. Well appearing and in no distress. Eyes: Conjunctivae are normal. PERRL. Normal extraocular movements. ENT   Head: Normocephalic and atraumatic.   Nose: No congestion/rhinnorhea.   Mouth/Throat: Mucous membranes are moist.   Neck: No stridor. Cardiovascular: Normal rate, regular rhythm. No murmurs, rubs, or gallops. Respiratory: Normal respiratory effort without tachypnea nor retractions. Breath sounds are clear and equal bilaterally. No wheezes/rales/rhonchi. Gastrointestinal: Soft and nontender. Normal bowel sounds Musculoskeletal: Nontender with normal range of motion in all extremities. No lower extremity tenderness nor edema. Neurologic:  Normal speech and language. No gross focal neurologic deficits are appreciated.  Skin:  Skin is warm, dry and intact. No rash noted. Psychiatric: Mood and affect are normal. Speech and behavior are normal.  ____________________________________________  ED COURSE:  Pertinent labs & imaging results that were available during my care of the patient were reviewed by me and considered in my medical decision making (see chart for details). Clinical Course  Patient is in no acute distress, states most of her symptoms have resolved this time. I will perform an ultrasound of the female organs.  Procedures ____________________________________________   LABS (pertinent positives/negatives)  Labs Reviewed  COMPREHENSIVE METABOLIC PANEL - Abnormal; Notable  for the following:       Result Value   Glucose, Bld 112 (*)    Anion gap 4 (*)    All other components within normal limits  CBC - Abnormal; Notable for the following:    WBC 11.1 (*)    Hemoglobin 11.9 (*)    MCV 72.1 (*)    MCH 23.5 (*)    RDW 16.5 (*)    All other components within normal  limits  URINALYSIS COMPLETEWITH MICROSCOPIC ([facility] ONLY) - Abnormal; Notable for the following:    Color, Urine YELLOW (*)    APPearance CLEAR (*)    Hgb urine dipstick 3+ (*)    Squamous Epithelial / LPF 0-5 (*)    All other components within normal limits  WET PREP, GENITAL  CHLAMYDIA/NGC RT PCR ([facility] ONLY)  LIPASE, BLOOD  POC URINE PREG, ED  POCT PREGNANCY, URINE    RADIOLOGY  Pelvic ultrasound  IMPRESSION: 1. Normal ovaries.  No adnexal torsion. 2. Normal anteverted uterus. No uterine fibroids. No endometrial abnormality. ____________________________________________  FINAL ASSESSMENT AND PLAN  Abdominal pain, Dysmenorrhea, nausea  Plan: Patient with labs and imaging as dictated above. I discussed possibly doing a pelvic exam on the patient. She will have this done as an outpatient should her symptoms persist. She'll be given antiemetics, pain medicine and she is encouraged to have close outpatient follow-up with her OB/GYN doctor. Ultrasound and labs here were reassuring.   Emily FilbertWilliams, Jonathan E, MD   Note: This dictation was prepared with Dragon dictation. Any transcriptional errors that result from this process are unintentional    Emily FilbertJonathan E Williams, MD 07/09/16 1431

## 2016-07-09 NOTE — ED Triage Notes (Signed)
[facility] for [facility]/o RLQ abdominal pain with nausea. No vomiting or diarrhea as of yet. Patient had appendectomy years ago, as well as tubal ligation. Patient denies any radiating pain. States pain began 1 hour ago after waking.

## 2017-03-09 IMAGING — US US ART/VEN ABD/PELV/SCROTUM DOPPLER LTD
1 series · 13 of 25 positions shown · non-contrast
Comparison: No prior non obstetric pelvic sonogram.

CLINICAL DATA: 30-year-old female with right lower quadrant/right
pelvic pain since 9 a.m.. Prior appendectomy and tubal ligation. LMP
07/07/2016.

EXAM:
TRANSABDOMINAL AND TRANSVAGINAL ULTRASOUND OF PELVIS
DOPPLER ULTRASOUND OF OVARIES
TECHNIQUE: Both transabdominal and transvaginal ultrasound examinations of the
pelvis were performed. Transabdominal technique was performed for
global imaging of the pelvis including uterus, ovaries, adnexal
regions, and pelvic cul-de-sac.
It was necessary to proceed with endovaginal exam following the
transabdominal exam to visualize the endometrium and adnexa. Color
and duplex Doppler ultrasound was utilized to evaluate blood flow to
the ovaries.

[Series 1: us art/ven abd/pelv/scrotum doppler ltd · 0.23mm/px · 13 of 163 slices shown]
[im 1/163]
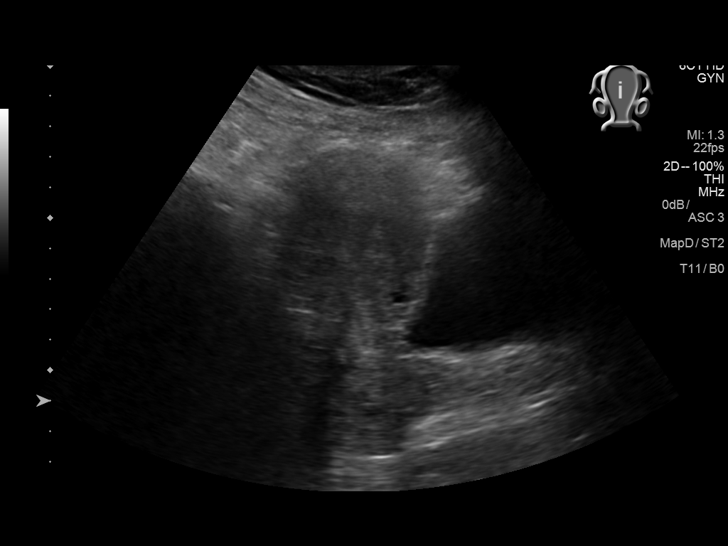
[im 14/163]
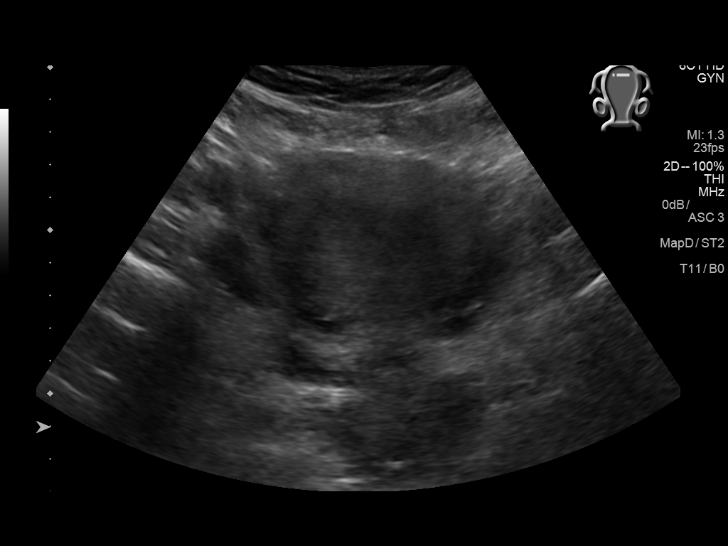
[im 28/163]
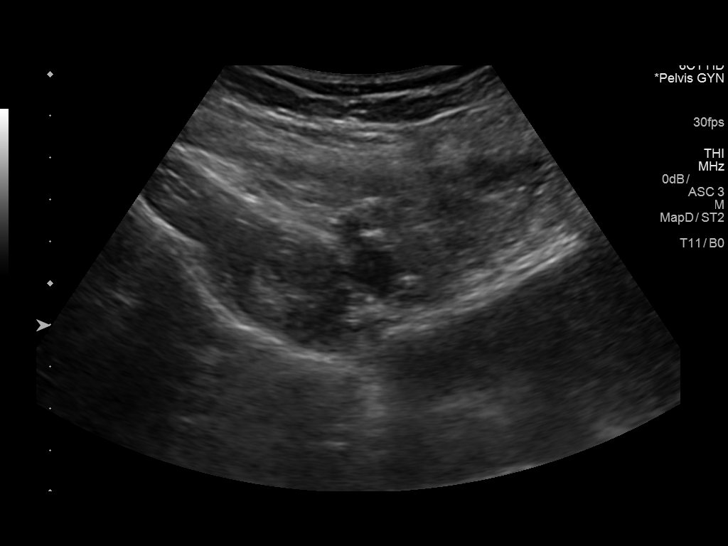
[im 41/163]
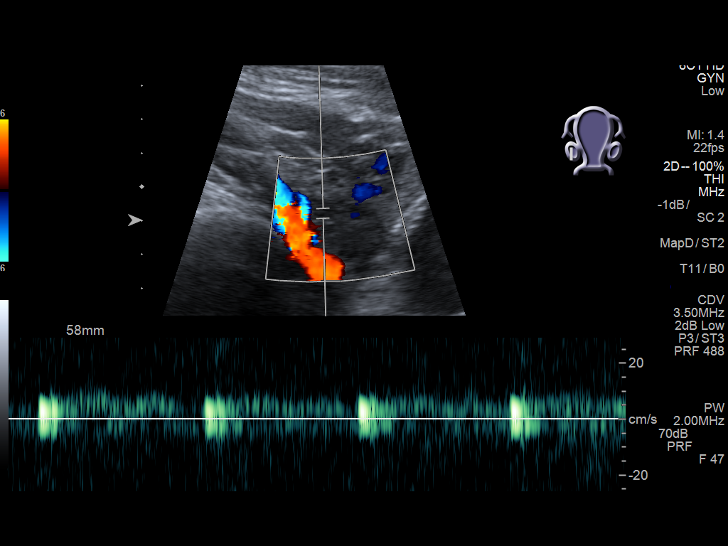
[im 55/163]
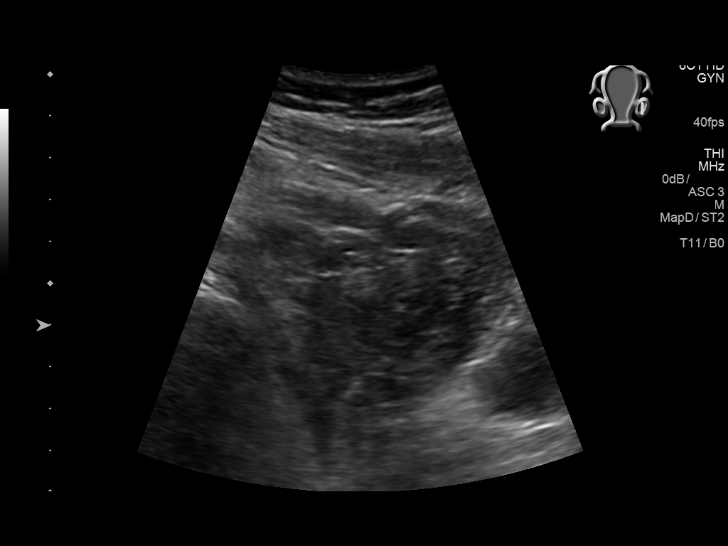
[im 68/163]
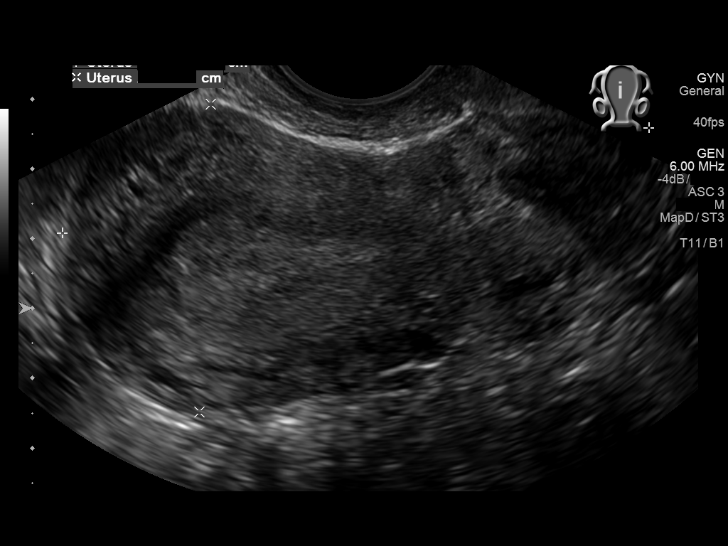
[im 82/163]
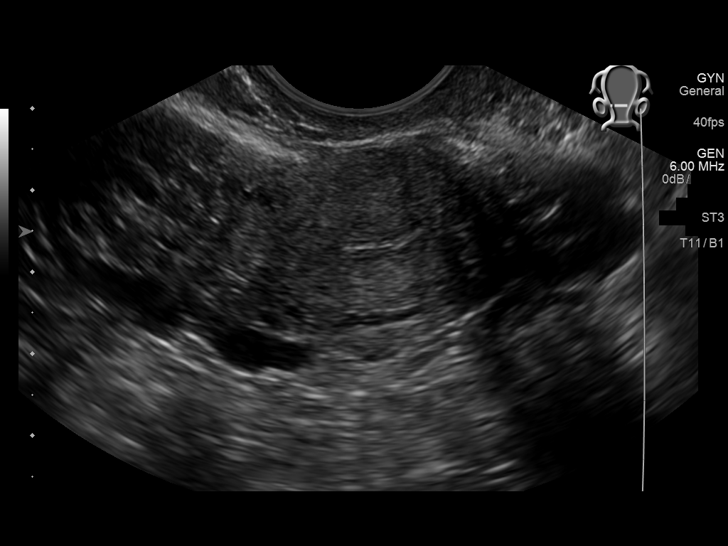
[im 95/163]
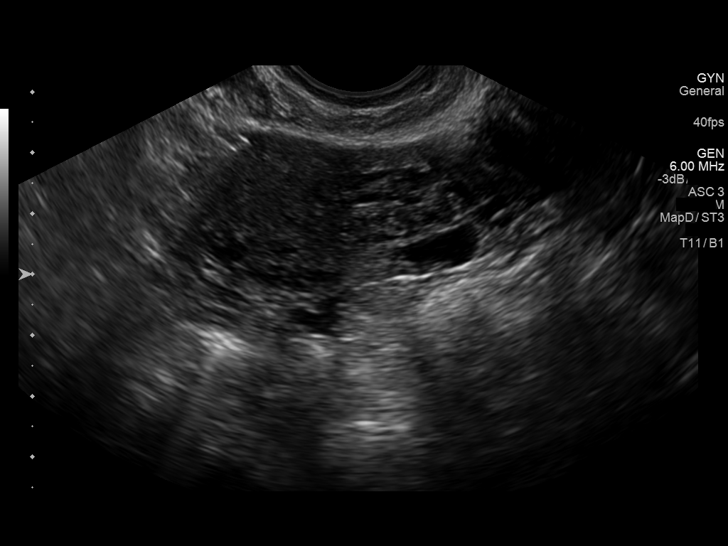
[im 109/163]
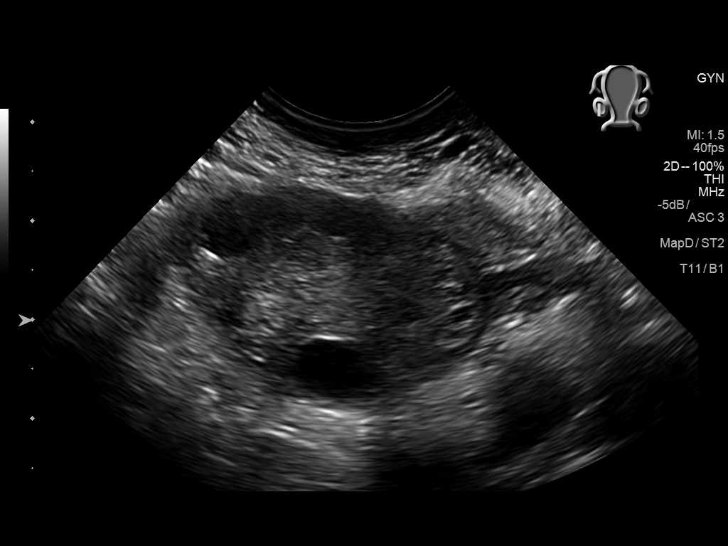
[im 122/163]
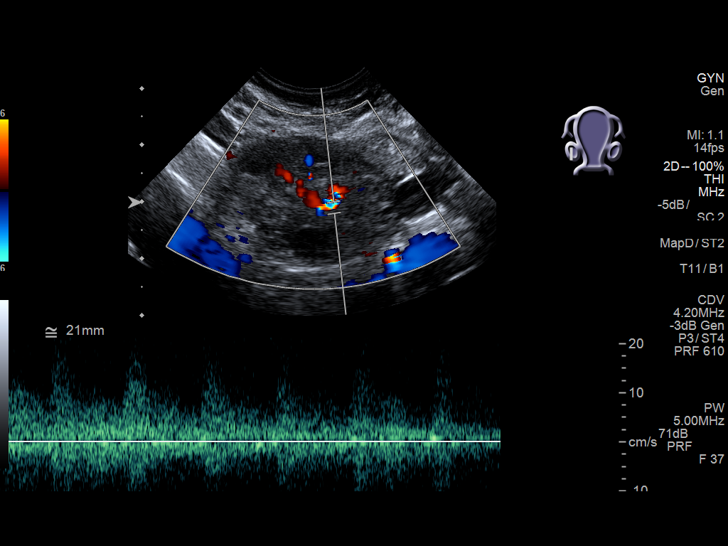
[im 136/163]
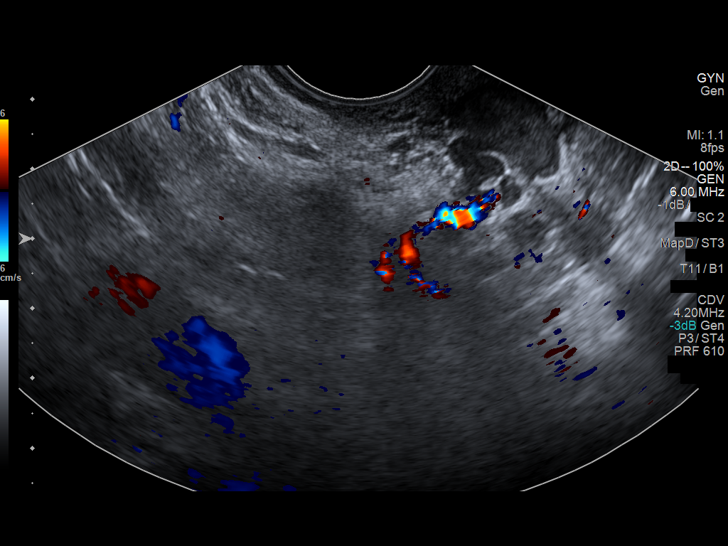
[im 149/163]
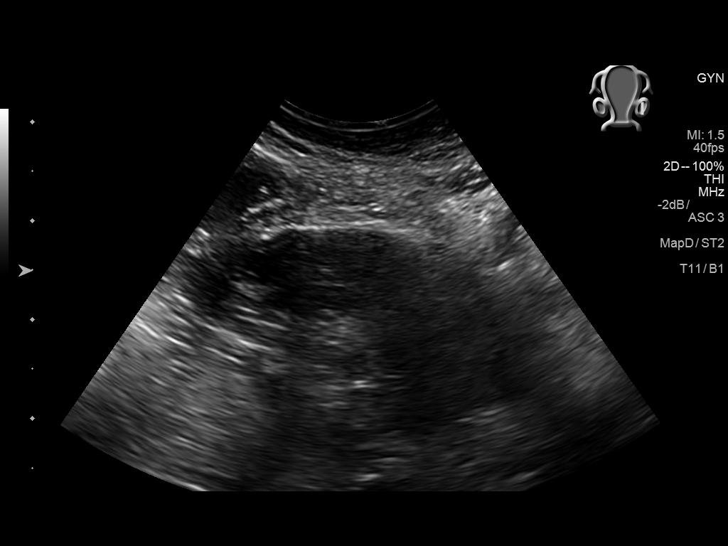
[im 163/163]
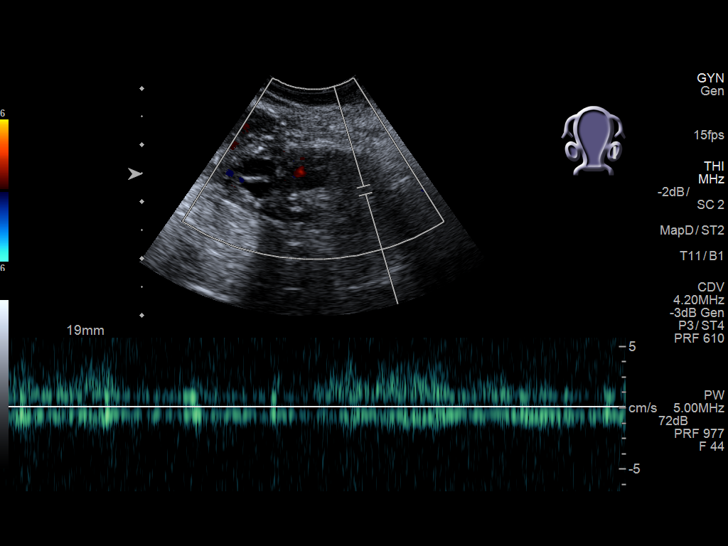

[13 of 25 positions shown; findings below may reference images not displayed]

FINDINGS: Uterus

Measurements: 8.5 x 4.4 x 6.0 cm. Anteverted anteflexed uterus is
normal in size and configuration, with no uterine fibroids or other
myometrial abnormalities.

Endometrium

Thickness: 13 mm. No endometrial cavity fluid or focal endometrial
mass demonstrated.

Right ovary

Measurements: 2.8 x 3.2 x 2.1 cm. Normal appearance/no adnexal mass.

Left ovary

Measurements: 3.1 x 1.7 x 2.0 cm. Normal appearance/no adnexal mass.

Pulsed Doppler evaluation of both ovaries demonstrates normal
low-resistance arterial and venous waveforms.

Other findings

No abnormal free fluid.
IMPRESSION: 1. Normal ovaries.  No adnexal torsion.
2. Normal anteverted uterus. No uterine fibroids. No endometrial
abnormality.

## 2018-09-24 ENCOUNTER — Ambulatory Visit: Payer: Self-pay | Admitting: Family Medicine

## 2018-09-24 DIAGNOSIS — Z0289 Encounter for other administrative examinations: Secondary | ICD-10-CM

## 2019-01-17 ENCOUNTER — Ambulatory Visit: Payer: Self-pay | Admitting: Urology

## 2019-03-10 ENCOUNTER — Ambulatory Visit: Payer: BLUE CROSS/BLUE SHIELD | Admitting: Urology

## 2019-03-10 ENCOUNTER — Encounter: Payer: Self-pay | Admitting: Urology

## 2021-04-18 ENCOUNTER — Other Ambulatory Visit: Payer: Self-pay

## 2021-04-18 MED ORDER — ESCITALOPRAM OXALATE 10 MG PO TABS
ORAL_TABLET | ORAL | 2 refills | Status: DC
  Filled 2021-04-18: qty 30, 30d supply, fill #0

## 2021-04-23 ENCOUNTER — Other Ambulatory Visit: Payer: Self-pay

## 2021-04-23 MED ORDER — NORGESTIMATE-ETH ESTRADIOL 0.25-35 MG-MCG PO TABS
ORAL_TABLET | ORAL | 0 refills | Status: DC
  Filled 2021-04-23: qty 84, 84d supply, fill #0

## 2021-06-13 ENCOUNTER — Other Ambulatory Visit: Payer: Self-pay

## 2021-06-13 MED ORDER — PRAMIPEXOLE DIHYDROCHLORIDE 0.125 MG PO TABS
ORAL_TABLET | ORAL | 11 refills | Status: DC
  Filled 2021-06-13: qty 30, 30d supply, fill #0

## 2021-10-01 ENCOUNTER — Other Ambulatory Visit: Payer: Self-pay

## 2021-10-01 MED ORDER — TRANEXAMIC ACID 650 MG PO TABS
ORAL_TABLET | ORAL | 3 refills | Status: DC
  Filled 2021-10-01 (×2): qty 30, 5d supply, fill #0

## 2021-12-26 ENCOUNTER — Other Ambulatory Visit: Payer: Self-pay

## 2021-12-26 MED ORDER — CITALOPRAM HYDROBROMIDE 20 MG PO TABS
ORAL_TABLET | ORAL | 11 refills | Status: DC
  Filled 2021-12-26 (×3): qty 30, 30d supply, fill #0

## 2021-12-27 ENCOUNTER — Inpatient Hospital Stay: Payer: Managed Care, Other (non HMO) | Attending: Internal Medicine | Admitting: Internal Medicine

## 2021-12-27 ENCOUNTER — Other Ambulatory Visit: Payer: Self-pay

## 2021-12-27 ENCOUNTER — Encounter: Payer: Self-pay | Admitting: Internal Medicine

## 2021-12-27 ENCOUNTER — Inpatient Hospital Stay: Payer: Managed Care, Other (non HMO)

## 2021-12-27 DIAGNOSIS — D509 Iron deficiency anemia, unspecified: Secondary | ICD-10-CM | POA: Insufficient documentation

## 2021-12-27 DIAGNOSIS — D649 Anemia, unspecified: Secondary | ICD-10-CM | POA: Insufficient documentation

## 2021-12-27 NOTE — Progress Notes (Signed)
[facility] ?CONSULT NOTE ? ?Patient Care Team: ?Patient, No Pcp Per (Inactive) as PCP - General (General Practice) ? ?CHIEF COMPLAINTS/PURPOSE OF CONSULTATION: ANEMIA ? ? ?HEMATOLOGY HISTORY ? ?# SEVERE IRON DEF-ANEMIA [FEB, 2023-[facility]- Hb 10; MCV-70; ferrtin-2];  ? ?#  ? ?HISTORY OF PRESENTING ILLNESS:  ?Marilyn Stanton 36 y.o.  female pleasant patient was been referred to [place] for further evaluation of anemia. ? ?Complains of fatigue. Poor tolerance to PO iron/diarrhea.  ? ?Blood in stools:none ?Blood in urine: none ?Difficulty swallowing: ?Change of bowel movement/constipation:none ?Prior blood transfusion: none ?Liver disease: none ?Alcohol: none ?Bariatric surgery:none ? ? ?Vaginal bleeding: heavy; with gyn in April, 2023; prior [facility] [IUD- cannot; BCP; ? TAH] ?Prior evaluation with hematology: none ?Prior bone marrow biopsy: none ?Oral iron: dyspepsia/diarrhea-  ?Prior IV iron infusions:none ?  ?Review of Systems  ?Constitutional:  Positive for malaise/fatigue. Negative for chills, diaphoresis, fever and weight loss.  ?HENT:  Negative for nosebleeds and sore throat.   ?Eyes:  Negative for double vision.  ?Respiratory:  Negative for cough, hemoptysis, sputum production, shortness of breath and wheezing.   ?Cardiovascular:  Negative for chest pain, palpitations, orthopnea and leg swelling.  ?Gastrointestinal:  Negative for abdominal pain, blood in stool, constipation, diarrhea, heartburn, melena, nausea and vomiting.  ?Genitourinary:  Negative for dysuria, frequency and urgency.  ?Musculoskeletal:  Negative for back pain and joint pain.  ?Skin: Negative.  Negative for itching and rash.  ?Neurological:  Negative for dizziness, tingling, focal weakness, weakness and headaches.  ?Endo/Heme/Allergies:  Does not bruise/bleed easily.  ?Psychiatric/Behavioral:  Negative for depression. The patient is not nervous/anxious and does not have insomnia.   ? ? ?MEDICAL HISTORY:  ?Past Medical History:  ?Diagnosis  Date  ? Anxiety   ? Asthma   ? ? ?SURGICAL HISTORY: ?Past Surgical History:  ?Procedure Laterality Date  ? APPENDECTOMY    ? CESAREAN SECTION    ? 08/26/2013, 07/14/2010  ? TUBAL LIGATION    ? ? ?SOCIAL HISTORY: ?Social History  ? ?Socioeconomic History  ? Marital status: Single  ?  Spouse name: Not on file  ? Number of children: Not on file  ? Years of education: Not on file  ? Highest education level: Not on file  ?Occupational History  ? Not on file  ?Tobacco Use  ? Smoking status: Former  ?  Types: Cigarettes  ? Smokeless tobacco: Never  ?Vaping Use  ? Vaping Use: Never used  ?Substance and Sexual Activity  ? Alcohol use: Yes  ? Drug use: Not on file  ? Sexual activity: Not on file  ?Other Topics Concern  ? Not on file  ?Social History Narrative  ? 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   ? ?Social Determinants of Health  ? ?Financial Resource Strain: Not on file  ?Food Insecurity: Not on file  ?Transportation Needs: Not on file  ?Physical Activity: Not on file  ?Stress: Not on file  ?Social Connections: Not on file  ?Intimate Partner Violence: Not on file  ? ? ?FAMILY HISTORY: ?Family History  ?Problem Relation Age of Onset  ? Kidney cancer Maternal Grandmother   ? Cervical cancer Paternal Grandmother   ? ? ?ALLERGIES:  is allergic to reglan [metoclopramide] and promethazine. ? ?MEDICATIONS:  ?Current Outpatient Medications  ?Medication Sig Dispense Refill  ? citalopram (CELEXA) 20 MG tablet Take 1 tablet (20 mg total) by mouth once daily 30 tablet 11  ? pramipexole (MIRAPEX) 0.125 MG  tablet Take 1 tablet (0.125 mg total) by mouth at bedtime 30 tablet 11  ? escitalopram (LEXAPRO) 10 MG tablet Take 1 tablet (10 mg total) by mouth once daily 30 tablet 2  ? norgestimate-ethinyl estradiol (ORTHO-CYCLEN) 0.25-35 MG-MCG tablet Take 1 tablet by mouth once daily 84 tablet 0  ? oxyCODONE-acetaminophen (PERCOCET) 5-325 MG tablet Take 2 tablets by mouth every 6 (six) hours as needed  for moderate pain or severe pain. 20 tablet 0  ? ?No current facility-administered medications for this visit.  ? ?  ?. ? ?PHYSICAL EXAMINATION: ? ? ?Vitals:  ? 12/27/21 1412  ?BP: 126/86  ?Pulse: 74  ?Temp: 98.8 ?F (37.1 ?C)  ?SpO2: 100%  ? ?Filed Weights  ? 12/27/21 1412  ?Weight: 174 lb 3.2 oz (79 kg)  ? ? ?Physical Exam ?Vitals and nursing note reviewed.  ?HENT:  ?   Head: Normocephalic and atraumatic.  ?   Mouth/Throat:  ?   Pharynx: Oropharynx is clear.  ?Eyes:  ?   Extraocular Movements: Extraocular movements intact.  ?   Pupils: Pupils are equal, round, and reactive to light.  ?Cardiovascular:  ?   Rate and Rhythm: Normal rate and regular rhythm.  ?Pulmonary:  ?   Comments: Decreased breath sounds bilaterally.  ?Abdominal:  ?   Palpations: Abdomen is soft.  ?Musculoskeletal:     ?   General: Normal range of motion.  ?   Cervical back: Normal range of motion.  ?Skin: ?   General: Skin is warm.  ?Neurological:  ?   General: No focal deficit present.  ?   Mental Status: She is alert and oriented to person, place, and time.  ?Psychiatric:     ?   Behavior: Behavior normal.     ?   Judgment: Judgment normal.  ?  ? ?LABORATORY DATA:  ?I have reviewed the data as listed ?Lab Results  ?Component Value Date  ? WBC 11.1 (H) 07/09/2016  ? HGB 11.9 (L) 07/09/2016  ? HCT 36.5 07/09/2016  ? MCV 72.1 (L) 07/09/2016  ? PLT 254 07/09/2016  ? ?No results for input(s): NA, K, CL, CO2, GLUCOSE, BUN, CREATININE, CALCIUM, GFRNONAA, GFRAA, PROT, ALBUMIN, AST, ALT, ALKPHOS, BILITOT, BILIDIR, IBILI in the last 8760 hours. ? ? ?No results found. ? ?ASSESSMENT & PLAN:  ? ?Symptomatic anemia ?#Iron deficient anemia:[[facility]- Hb 10; MCV-70; ferrtin-2]; extremely fatigued. Poor tolerance to PO iron/diarrhea. Discussed the potential acute infusion reactions with IV iron; which are quite rare.  Patient understands the risk; will proceed with infusions.  ? ?#Etiology of iron deficiency: likely sec to menorrhagia [awaiting II opinion [place]  side-Gyn]. ? ?Thank you, Mr.Whitakker for allowing me to participate in the care of your pleasant patient. Please do not hesitate to contact me with questions or concerns in the interim. ? ?# DISPOSITION: ?# NO labs today ?# venofer weekly x 4- start next week ?# follow up in 6 weeks- MD; labs- cbc/possible venofer-Dr.B ? ? ? ?All questions were answered. The patient knows to call the clinic with any problems, questions or concerns. ? ? ? Cammie Sickle, MD ?12/27/2021 5:17 PM ? ?

## 2021-12-27 NOTE — Patient Instructions (Signed)
#  Recommend gentle iron 1 pill a day; should not upset your stomach or cause constipation.  Talk to the pharmacist if you can find it/it is over-the-counter.  

## 2021-12-27 NOTE — Assessment & Plan Note (Addendum)
#  Iron deficient anemia:[[facility]- Hb 10; MCV-70; ferrtin-2]; extremely fatigued. Poor tolerance to PO iron/diarrhea. Discussed the potential acute infusion reactions with IV iron; which are quite rare.  Patient understands the risk; will proceed with infusions.  ? ?#Etiology of iron deficiency: likely sec to menorrhagia [awaiting II opinion [place] side-Gyn]. ? ?Thank you, Mr.Whitakker for allowing me to participate in the care of your pleasant patient. Please do not hesitate to contact me with questions or concerns in the interim. ? ?# DISPOSITION: ?# NO labs today ?# venofer weekly x 4- start next week ?# follow up in 6 weeks- MD; labs- cbc/possible venofer-Dr.B ? ?

## 2021-12-31 ENCOUNTER — Other Ambulatory Visit: Payer: Self-pay

## 2021-12-31 ENCOUNTER — Inpatient Hospital Stay: Payer: Managed Care, Other (non HMO)

## 2022-01-07 ENCOUNTER — Telehealth: Payer: Self-pay | Admitting: *Deleted

## 2022-01-07 ENCOUNTER — Other Ambulatory Visit: Payer: Self-pay

## 2022-01-07 ENCOUNTER — Emergency Department
Admission: EM | Admit: 2022-01-07 | Discharge: 2022-01-07 | Disposition: A | Discharge status: Home / Self Care | Payer: Managed Care, Other (non HMO) | Attending: Emergency Medicine | Admitting: Emergency Medicine

## 2022-01-07 ENCOUNTER — Inpatient Hospital Stay: Payer: Managed Care, Other (non HMO)

## 2022-01-07 DIAGNOSIS — D72829 Elevated white blood cell count, unspecified: Secondary | ICD-10-CM | POA: Diagnosis not present

## 2022-01-07 DIAGNOSIS — D509 Iron deficiency anemia, unspecified: Secondary | ICD-10-CM | POA: Diagnosis not present

## 2022-01-07 DIAGNOSIS — R519 Headache, unspecified: Secondary | ICD-10-CM | POA: Diagnosis not present

## 2022-01-07 DIAGNOSIS — D75839 Thrombocytosis, unspecified: Secondary | ICD-10-CM | POA: Diagnosis not present

## 2022-01-07 DIAGNOSIS — R112 Nausea with vomiting, unspecified: Secondary | ICD-10-CM | POA: Diagnosis not present

## 2022-01-07 DIAGNOSIS — D649 Anemia, unspecified: Secondary | ICD-10-CM

## 2022-01-07 LAB — CBC
HCT: 34 % — ABNORMAL LOW (ref 36.0–46.0)
Hemoglobin: 9.9 g/dL — ABNORMAL LOW (ref 12.0–15.0)
MCH: 20.1 pg — ABNORMAL LOW (ref 26.0–34.0)
MCHC: 29.1 g/dL — ABNORMAL LOW (ref 30.0–36.0)
MCV: 69.1 fL — ABNORMAL LOW (ref 80.0–100.0)
Platelets: 462 10*3/uL — ABNORMAL HIGH (ref 150–400)
RBC: 4.92 MIL/uL (ref 3.87–5.11)
RDW: 16.1 % — ABNORMAL HIGH (ref 11.5–15.5)
WBC: 11.4 10*3/uL — ABNORMAL HIGH (ref 4.0–10.5)
nRBC: 0 % (ref 0.0–0.2)

## 2022-01-07 LAB — COMPREHENSIVE METABOLIC PANEL
ALT: 29 U/L (ref 0–44)
AST: 22 U/L (ref 15–41)
Albumin: 4.3 g/dL (ref 3.5–5.0)
Alkaline Phosphatase: 60 U/L (ref 38–126)
Anion gap: 11 (ref 5–15)
BUN: 11 mg/dL (ref 6–20)
CO2: 23 mmol/L (ref 22–32)
Calcium: 9 mg/dL (ref 8.9–10.3)
Chloride: 103 mmol/L (ref 98–111)
Creatinine, Ser: 0.98 mg/dL (ref 0.44–1.00)
GFR, Estimated: >60 mL/min (ref >60)
Glucose, Bld: 105 mg/dL — ABNORMAL HIGH (ref 70–99)
Potassium: 3.5 mmol/L (ref 3.5–5.1)
Sodium: 137 mmol/L (ref 135–145)
Total Bilirubin: 0.9 mg/dL (ref 0.3–1.2)
Total Protein: 8.3 g/dL — ABNORMAL HIGH (ref 6.5–8.1)

## 2022-01-07 LAB — IRON AND TIBC
Iron: 23 ug/dL — ABNORMAL LOW (ref 28–170)
Saturation Ratios: 5 % — ABNORMAL LOW (ref 10.4–31.8)
TIBC: 444 ug/dL (ref 250–450)
UIBC: 421 ug/dL

## 2022-01-07 LAB — RETICULOCYTES
Immature Retic Fract: 22.6 % — ABNORMAL HIGH (ref 2.3–15.9)
RBC.: 4.91 MIL/uL (ref 3.87–5.11)
Retic Count, Absolute: 80.5 10*3/uL (ref 19.0–186.0)
Retic Ct Pct: 1.6 % (ref 0.4–3.1)

## 2022-01-07 LAB — HCG, QUANTITATIVE, PREGNANCY: hCG, Beta Chain, Quant, S: <1 m[IU]/mL (ref <5)

## 2022-01-07 LAB — HEMOGLOBIN AND HEMATOCRIT, BLOOD
HCT: 33 % — ABNORMAL LOW (ref 36.0–46.0)
Hemoglobin: 9.5 g/dL — ABNORMAL LOW (ref 12.0–15.0)

## 2022-01-07 MED ORDER — PROCHLORPERAZINE EDISYLATE 10 MG/2ML IJ SOLN
10.0000 mg | INTRAMUSCULAR | Status: AC
  Administered 2022-01-07: 10 mg via INTRAVENOUS
  Filled 2022-01-07: qty 2

## 2022-01-07 MED ORDER — DIPHENHYDRAMINE HCL 50 MG/ML IJ SOLN
50.0000 mg | Freq: Once | INTRAMUSCULAR | Status: AC
  Administered 2022-01-07: 50 mg via INTRAVENOUS
  Filled 2022-01-07: qty 1

## 2022-01-07 MED ORDER — ONDANSETRON 4 MG PO TBDP: 4.0000 mg | ORAL_TABLET | Freq: Three times a day (TID) | ORAL | 0 refills | Status: DC | PRN

## 2022-01-07 MED ORDER — ONDANSETRON 4 MG PO TBDP
4.0000 mg | ORAL_TABLET | Freq: Three times a day (TID) | ORAL | 0 refills | Status: AC | PRN
  Filled 2022-01-07: qty 10, 4d supply, fill #0

## 2022-01-07 MED ORDER — ONDANSETRON HCL 4 MG/2ML IJ SOLN
4.0000 mg | Freq: Once | INTRAMUSCULAR | Status: AC
Start: 2022-01-07 — End: 2022-01-07
  Administered 2022-01-07: 4 mg via INTRAVENOUS
  Filled 2022-01-07: qty 2

## 2022-01-07 MED ORDER — LACTATED RINGERS IV BOLUS
1000.0000 mL | Freq: Once | INTRAVENOUS | Status: AC
  Administered 2022-01-07: 1000 mL via INTRAVENOUS

## 2022-01-07 NOTE — Telephone Encounter (Signed)
Incoming vm msg on scheduling line from pt's mom, Melissa Myatt. Patient currently in the ER with a low hgb and unable to make today's apt for her iron infusion.  Pt had a 6.1 hgb at [facility] and was sent to ER. ? ?Pt/pt's family will call back later to r/s. Apt cnl per pt's request. ?

## 2022-01-07 NOTE — ED Provider Notes (Signed)
? ?[facility] ?Provider Note ? ? ? Event Date/Time  ? First MD Initiated Contact with Patient 01/07/22 1112   ?  (approximate) ? ? ?History  ? ?Abnormal Lab ? ? ?HPI ? ?Marilyn ReichertHeather Novakovich is a 36 y.o. female with past medical history of iron deficiency anemia with plan to initiate outpatient iron infusions although patient states she has not started this yet who presents to the emergency room after [facility] was [facility] earlier today and had a CBC drawn that showed low hemoglobin.  Patient states he was being evaluated for some nausea and vomiting and fatigue.  She states that she started feeling sick 8 or 9 days ago with sore throat, fevers, headache and nausea and vomiting and was prescribed Augmentin for throat infection despite a negative strep test.  She states he was not able to swallow pills because they were too large and went to be seen again in clinic on 3/24 when she was given steroid dose and oral Augmentin.  She states that since then she has been feeling much better with resolution of her sore throat improvement in her headache but still has some persistent headache, fatigue and nausea and vomiting.  She states she is struggled with anemia for a long time and it was thought to be secondary to heavy menstrual bleeding.  She states her stools have been a little darker last couple days.  She states she typically has a menstrual period the last 7 days and that she her last period was 2 weeks ago.  She is in the process of getting a second opinion from another OB but is not scheduled for this for several months.  He has never had any GI bleeding other than taking some ibuprofen for sore throat and fevers last week does not regular take NSAIDs.  She has no chest pain, cough of breath, abdominal pain, current bleeding, any bloody stools or bloody urine or any recent injuries or falls.  It has been a couple months since she has been on any oral iron  supplementation.  Denies any other acute concerns at this time other than stating she is feeling very dehydrated but she did get some fluids earlier today and that helped a little bit.  She also had influenza and COVID PCR done earlier today that was negative. ? ?  ? ? ?Physical Exam  ?Triage Vital Signs: ?ED Triage Vitals  ?Enc Vitals Group  ?   BP 01/07/22 1105 121/76  ?   Pulse Rate 01/07/22 1105 (!) 106  ?   Resp 01/07/22 1105 16  ?   Temp --   ?   Temp src --   ?   SpO2 01/07/22 1105 100 %  ?   Weight 01/07/22 1057 164 lb (74.4 kg)  ?   Height 01/07/22 1057 5\' 6"  (1.676 m)  ?   Head Circumference --   ?   Peak Flow --   ?   Pain Score 01/07/22 1057 0  ?   Pain Loc --   ?   Pain Edu? --   ?   Excl. in GC? --   ? ? ?Most recent vital signs: ?Vitals:  ? 01/07/22 1250 01/07/22 1300  ?BP:  (!) 129/94  ?Pulse:  83  ?Resp:    ?Temp: 98.8 ?F (37.1 ?C)   ?SpO2:  100%  ? ? ?General: Awake, no distress.  ?CV:  Slightly prolonged cap refill in the digits..Marland Kitchen  2+ radial pulse.  Slightly tachycardic. ?Resp:  Normal effort.  Clear bilaterally. ?Abd:  No distention.  Soft throughout. ?Other:  Mild posterior oropharyngeal erythema without any significant exudates, tonsil enlargement or uvular deviation. ? ?PERRLA.  EOMI.  Moving all extremities spontaneously. ? ? ?ED Results / Procedures / Treatments  ?Labs ?(all labs ordered are listed, but only abnormal results are displayed) ?Labs Reviewed  ?COMPREHENSIVE METABOLIC PANEL - Abnormal; Notable for the following components:  ?    Result Value  ? Glucose, Bld 105 (*)   ? Total Protein 8.3 (*)   ? All other components within normal limits  ?CBC - Abnormal; Notable for the following components:  ? WBC 11.4 (*)   ? Hemoglobin 9.9 (*)   ? HCT 34.0 (*)   ? MCV 69.1 (*)   ? MCH 20.1 (*)   ? MCHC 29.1 (*)   ? RDW 16.1 (*)   ? Platelets 462 (*)   ? All other components within normal limits  ?RETICULOCYTES - Abnormal; Notable for the following components:  ? Immature Retic Fract 22.6 (*)    ? All other components within normal limits  ?HEMOGLOBIN AND HEMATOCRIT, BLOOD - Abnormal; Notable for the following components:  ? Hemoglobin 9.5 (*)   ? HCT 33.0 (*)   ? All other components within normal limits  ?IRON AND TIBC - Abnormal; Notable for the following components:  ? Iron 23 (*)   ? Saturation Ratios 5 (*)   ? All other components within normal limits  ?HCG, QUANTITATIVE, PREGNANCY  ? ? ? ?EKG ? ? ? ?RADIOLOGY ? ? ? ?PROCEDURES: ? ?Critical Care performed: No ? ?.1-3 Lead EKG Interpretation ?Performed by: Gilles Chiquito, MD ?Authorized by: Gilles Chiquito, MD  ? ?  Interpretation: normal   ?  ECG rate assessment: normal   ?  Rhythm: sinus rhythm   ?  Ectopy: none   ?  Conduction: normal   ? ?The patient is on the cardiac monitor to evaluate for evidence of arrhythmia and/or significant heart rate changes. ? ? ?MEDICATIONS ORDERED IN ED: ?Medications  ?ondansetron (ZOFRAN) injection 4 mg (4 mg Intravenous Given 01/07/22 1202)  ?lactated ringers bolus 1,000 mL (0 mLs Intravenous Stopped 01/07/22 1336)  ?prochlorperazine (COMPAZINE) injection 10 mg (10 mg Intravenous Given 01/07/22 1253)  ?diphenhydrAMINE (BENADRYL) injection 50 mg (50 mg Intravenous Given 01/07/22 1253)  ? ? ? ?IMPRESSION / MDM / ASSESSMENT AND PLAN / ED COURSE  ?I reviewed the triage vital signs and the nursing notes. ?             ?               ? ?Differential diagnosis includes, but is not limited to, nausea and vomiting related to ongoing infectious gastritis possibly contributing fatigue, symptomatic anemia, metabolic derangements including electrolyte derangements, and kidney injury from dehydration. ? ?No evidence of deep space infection in the head or neck and her abdomen is soft and I have low suspicion for appendicitis or cholecystitis.  CMP shows no significant electrolyte or metabolic derangements.  No evidence of kidney injury or hepatitis.  CBC shows WBC count of 11.4 somewhat nonspecific with hemoglobin of 9.9 and  platelets of 462.  Given difference between 9.9 and 6.1 seen in clinic earlier today which also showed otherwise significantly almost 50% values for WBC count and platelet count of concern this may have been a diluted sample at clinic.  On recheck here it is 9.5.  Do not believe blood transfusion is currently indicated.  Retake count is 1.6.  Iron is low and I discussed this with patient and recommendation to continue working with heme-onc for outpatient iron infusions and work-up with her OB and PCP.  hCG is negative.  We will try some IV fluids Compazine and Benadryl for her headache.  Do not believe she is septic and I suspect her nausea and vomiting may be related to some ongoing gastritis from her recent viral URI or tonsillitis. ? ?Given reassuring exam and work-up with low suspicion for anemia requiring transfusion I think she is stable for discharge with outpatient follow-up.  Discussed returning for any new or worsening symptoms.  Rx written for Zofran.  Discharged in stable condition. ? ?  ? ? ?FINAL CLINICAL IMPRESSION(S) / ED DIAGNOSES  ? ?Final diagnoses:  ?Anemia, unspecified type  ?Nausea and vomiting, unspecified vomiting type  ?Nonintractable headache, unspecified chronicity pattern, unspecified headache type  ? ? ? ?Rx / DC Orders  ? ?ED Discharge Orders   ? ?      Ordered  ?  ondansetron (ZOFRAN-ODT) 4 MG disintegrating tablet  Every 8 hours PRN,   Status:  Discontinued       ? 01/07/22 1236  ?  ondansetron (ZOFRAN-ODT) 4 MG disintegrating tablet  Every 8 hours PRN       ? 01/07/22 1238  ? ?  ?  ? ?  ? ? ? ?Note:  This document was prepared using Dragon voice recognition software and may include unintentional dictation errors. ?  ?Gilles Chiquito, MD ?01/07/22 [phone] ? ?

## 2022-01-07 NOTE — ED Triage Notes (Addendum)
Pt sent from [facility] , states she was sick with sore throat, fever, weak, negative for covid/flu  pt c/o generalized weakness N/V and some loose dark stools, Hbg 6.1 today, was 10.7 12/30/21. States she has a hx of heavy periods but there are concerns for rectal bleeding, pt arrives with #[id number] , NS infused PTA ?

## 2022-01-08 ENCOUNTER — Other Ambulatory Visit: Payer: Self-pay

## 2022-01-08 ENCOUNTER — Encounter: Payer: Self-pay | Admitting: Internal Medicine

## 2022-01-10 ENCOUNTER — Encounter: Payer: Self-pay | Admitting: Internal Medicine

## 2022-01-10 ENCOUNTER — Inpatient Hospital Stay: Payer: Managed Care, Other (non HMO)

## 2022-01-10 ENCOUNTER — Other Ambulatory Visit: Payer: Self-pay

## 2022-01-10 VITALS — BP 127/77 | HR 99 | Temp 97.8°F | Resp 18

## 2022-01-10 DIAGNOSIS — D509 Iron deficiency anemia, unspecified: Secondary | ICD-10-CM | POA: Diagnosis not present

## 2022-01-10 DIAGNOSIS — D649 Anemia, unspecified: Secondary | ICD-10-CM

## 2022-01-10 MED ORDER — OMEPRAZOLE 40 MG PO CPDR
DELAYED_RELEASE_CAPSULE | ORAL | 11 refills | Status: DC
  Filled 2022-01-10: qty 30, 30d supply, fill #0

## 2022-01-10 MED ORDER — IRON SUCROSE 20 MG/ML IV SOLN
200.0000 mg | Freq: Once | INTRAVENOUS | Status: AC
  Administered 2022-01-10: 200 mg via INTRAVENOUS
  Filled 2022-01-10: qty 10

## 2022-01-10 MED ORDER — SODIUM CHLORIDE 0.9 % IV SOLN
Freq: Once | INTRAVENOUS | Status: AC
  Filled 2022-01-10: qty 250

## 2022-01-10 MED ORDER — SODIUM CHLORIDE 0.9 % IV SOLN: 200.0000 mg | Freq: Once | INTRAVENOUS | Status: DC

## 2022-01-14 ENCOUNTER — Inpatient Hospital Stay: Payer: Managed Care, Other (non HMO) | Attending: Internal Medicine

## 2022-01-14 VITALS — BP 104/59 | HR 77 | Temp 96.7°F | Resp 18

## 2022-01-14 DIAGNOSIS — D509 Iron deficiency anemia, unspecified: Secondary | ICD-10-CM | POA: Insufficient documentation

## 2022-01-14 DIAGNOSIS — D649 Anemia, unspecified: Secondary | ICD-10-CM

## 2022-01-14 MED ORDER — SODIUM CHLORIDE 0.9 % IV SOLN: 200.0000 mg | Freq: Once | INTRAVENOUS | Status: DC

## 2022-01-14 MED ORDER — IRON SUCROSE 20 MG/ML IV SOLN
200.0000 mg | Freq: Once | INTRAVENOUS | Status: AC
  Administered 2022-01-14: 200 mg via INTRAVENOUS
  Filled 2022-01-14: qty 10

## 2022-01-14 MED ORDER — SODIUM CHLORIDE 0.9 % IV SOLN
Freq: Once | INTRAVENOUS | Status: AC
  Filled 2022-01-14: qty 250

## 2022-01-14 NOTE — Patient Instructions (Signed)

## 2022-01-21 ENCOUNTER — Inpatient Hospital Stay: Payer: Managed Care, Other (non HMO)

## 2022-01-21 VITALS — BP 128/75 | HR 79

## 2022-01-21 DIAGNOSIS — D649 Anemia, unspecified: Secondary | ICD-10-CM

## 2022-01-21 DIAGNOSIS — D509 Iron deficiency anemia, unspecified: Secondary | ICD-10-CM | POA: Diagnosis not present

## 2022-01-21 MED ORDER — IRON SUCROSE 20 MG/ML IV SOLN
200.0000 mg | Freq: Once | INTRAVENOUS | Status: AC
  Administered 2022-01-21: 200 mg via INTRAVENOUS
  Filled 2022-01-21: qty 10

## 2022-01-21 MED ORDER — SODIUM CHLORIDE 0.9 % IV SOLN: 200.0000 mg | Freq: Once | INTRAVENOUS | Status: DC

## 2022-01-21 MED ORDER — SODIUM CHLORIDE 0.9 % IV SOLN
Freq: Once | INTRAVENOUS | Status: AC
  Filled 2022-01-21: qty 250

## 2022-01-28 ENCOUNTER — Inpatient Hospital Stay: Payer: Managed Care, Other (non HMO)

## 2022-01-28 VITALS — BP 126/75 | HR 75 | Temp 98.6°F | Resp 18

## 2022-01-28 DIAGNOSIS — D509 Iron deficiency anemia, unspecified: Secondary | ICD-10-CM | POA: Diagnosis not present

## 2022-01-28 DIAGNOSIS — D649 Anemia, unspecified: Secondary | ICD-10-CM

## 2022-01-28 MED ORDER — SODIUM CHLORIDE 0.9 % IV SOLN
Freq: Once | INTRAVENOUS | Status: AC
  Filled 2022-01-28: qty 250

## 2022-01-28 MED ORDER — IRON SUCROSE 20 MG/ML IV SOLN
200.0000 mg | Freq: Once | INTRAVENOUS | Status: AC
  Administered 2022-01-28: 200 mg via INTRAVENOUS
  Filled 2022-01-28: qty 10

## 2022-01-28 MED ORDER — SODIUM CHLORIDE 0.9 % IV SOLN: 200.0000 mg | Freq: Once | INTRAVENOUS | Status: DC

## 2022-01-28 NOTE — Progress Notes (Signed)
Patient tolerated Venofer infusion well today, no concerns voiced. Patient stable at discharge. Refused AVS .   ?

## 2022-01-28 NOTE — Patient Instructions (Signed)

## 2022-01-31 ENCOUNTER — Encounter: Payer: BLUE CROSS/BLUE SHIELD | Admitting: Obstetrics

## 2022-02-07 ENCOUNTER — Inpatient Hospital Stay: Payer: Managed Care, Other (non HMO)

## 2022-02-07 ENCOUNTER — Inpatient Hospital Stay: Payer: Managed Care, Other (non HMO) | Admitting: Internal Medicine

## 2022-02-14 ENCOUNTER — Encounter: Payer: Self-pay | Admitting: Obstetrics

## 2022-02-27 ENCOUNTER — Encounter: Payer: Self-pay | Admitting: Obstetrics

## 2022-03-05 ENCOUNTER — Inpatient Hospital Stay: Payer: Managed Care, Other (non HMO) | Attending: Internal Medicine

## 2022-03-05 ENCOUNTER — Inpatient Hospital Stay: Payer: Managed Care, Other (non HMO)

## 2022-03-05 ENCOUNTER — Inpatient Hospital Stay (HOSPITAL_BASED_OUTPATIENT_CLINIC_OR_DEPARTMENT_OTHER): Payer: Managed Care, Other (non HMO) | Admitting: Internal Medicine

## 2022-03-05 ENCOUNTER — Encounter: Payer: Self-pay | Admitting: Internal Medicine

## 2022-03-05 DIAGNOSIS — D509 Iron deficiency anemia, unspecified: Secondary | ICD-10-CM | POA: Diagnosis present

## 2022-03-05 DIAGNOSIS — D649 Anemia, unspecified: Secondary | ICD-10-CM

## 2022-03-05 LAB — CBC WITH DIFFERENTIAL/PLATELET
Abs Immature Granulocytes: 0.06 10*3/uL (ref 0.00–0.07)
Basophils Absolute: 0.1 10*3/uL (ref 0.0–0.1)
Basophils Relative: 1 %
Eosinophils Absolute: 0.1 10*3/uL (ref 0.0–0.5)
Eosinophils Relative: 1 %
HCT: 43.2 % (ref 36.0–46.0)
Hemoglobin: 13.6 g/dL (ref 12.0–15.0)
Immature Granulocytes: 1 %
Lymphocytes Relative: 25 %
Lymphs Abs: 2.2 10*3/uL (ref 0.7–4.0)
MCH: 25.2 pg — ABNORMAL LOW (ref 26.0–34.0)
MCHC: 31.5 g/dL (ref 30.0–36.0)
MCV: 80 fL (ref 80.0–100.0)
Monocytes Absolute: 0.6 10*3/uL (ref 0.1–1.0)
Monocytes Relative: 7 %
Neutro Abs: 5.5 10*3/uL (ref 1.7–7.7)
Neutrophils Relative %: 65 %
Platelets: 301 10*3/uL (ref 150–400)
RBC: 5.4 MIL/uL — ABNORMAL HIGH (ref 3.87–5.11)
RDW: 21.5 % — ABNORMAL HIGH (ref 11.5–15.5)
WBC: 8.6 10*3/uL (ref 4.0–10.5)
nRBC: 0 % (ref 0.0–0.2)

## 2022-03-05 MED ORDER — IRON SUCROSE 20 MG/ML IV SOLN
200.0000 mg | Freq: Once | INTRAVENOUS | Status: AC
  Administered 2022-03-05: 200 mg via INTRAVENOUS
  Filled 2022-03-05: qty 10

## 2022-03-05 MED ORDER — SODIUM CHLORIDE 0.9 % IV SOLN
Freq: Once | INTRAVENOUS | Status: AC
  Filled 2022-03-05: qty 250

## 2022-03-05 MED ORDER — SODIUM CHLORIDE 0.9 % IV SOLN: 200.0000 mg | Freq: Once | INTRAVENOUS | Status: DC

## 2022-03-05 NOTE — Assessment & Plan Note (Addendum)
#  Iron deficient anemia:[[facility]- Hb 10; MCV-70; ferrtin-2]; extremely fatigued. Poor tolerance to PO iron/diarrhea.  Symptomatic improvement noted post iron infusion.  # Proceed with venofer today  #Etiology of iron deficiency: likely sec to menorrhagia [awaiting II opinion [place] [facility]-Gyn- in July, 2023].  # DISPOSITION: # venofer today # follow up in 3 months - MD; labs- cbc/possible venofer-Dr.B  

## 2022-03-05 NOTE — Progress Notes (Signed)
Slight improvement in energy level.

## 2022-03-05 NOTE — Patient Instructions (Signed)
[facility]  Discharge Instructions: ?Thank you for choosing  Cancer Center to provide your oncology and hematology care.  ?If you have a lab appointment with the Cancer Center, please go directly to the Cancer Center and check in at the registration area. ? ?Wear comfortable clothing and clothing appropriate for easy access to any Portacath or PICC line.  ? ?We strive to give you quality time with your provider. You may need to reschedule your appointment if you arrive late (15 or more minutes).  Arriving late affects you and other patients whose appointments are after yours.  Also, if you miss three or more appointments without notifying the office, you may be dismissed from the clinic at the provider?s discretion.    ?  ?For prescription refill requests, have your pharmacy contact our office and allow 72 hours for refills to be completed.   ? ?Today you received the following chemotherapy and/or immunotherapy agents VENOFER ?    ?  ?To help prevent nausea and vomiting after your treatment, we encourage you to take your nausea medication as directed. ? ?BELOW ARE SYMPTOMS THAT SHOULD BE REPORTED IMMEDIATELY: ?*FEVER GREATER THAN 100.4 F (38 ?C) OR HIGHER ?*CHILLS OR SWEATING ?*NAUSEA AND VOMITING THAT IS NOT CONTROLLED WITH YOUR NAUSEA MEDICATION ?*UNUSUAL SHORTNESS OF BREATH ?*UNUSUAL BRUISING OR BLEEDING ?*URINARY PROBLEMS (pain or burning when urinating, or frequent urination) ?*BOWEL PROBLEMS (unusual diarrhea, constipation, pain near the anus) ?TENDERNESS IN MOUTH AND THROAT WITH OR WITHOUT PRESENCE OF ULCERS (sore throat, sores in mouth, or a toothache) ?UNUSUAL RASH, SWELLING OR PAIN  ?UNUSUAL VAGINAL DISCHARGE OR ITCHING  ? ?Items with * indicate a potential emergency and should be followed up as soon as possible or go to the Emergency Department if any problems should occur. ? ?Please show the CHEMOTHERAPY ALERT CARD or IMMUNOTHERAPY ALERT CARD at check-in to  the Emergency Department and triage nurse. ? ?Should you have questions after your visit or need to cancel or reschedule your appointment, please contact [facility]  [phone] and follow the prompts.  Office hours are 8:00 a.m. to 4:30 p.m. Monday - Friday. Please note that voicemails left after 4:00 p.m. may not be returned until the following business day.  We are closed weekends and major holidays. You have access to a nurse at all times for urgent questions. Please call the main number to the clinic [phone] and follow the prompts. ? ?For any non-urgent questions, you may also contact your provider using MyChart. We now offer e-Visits for anyone 18 and older to request care online for non-urgent symptoms. For details visit [email or web address]. ?  ?Also download the MyChart app! Go to the app store, search "MyChart", open the app, select , and log in with your MyChart username and password. ? ?Due to Covid, a mask is required upon entering the hospital/clinic. If you do not have a mask, one will be given to you upon arrival. For doctor visits, patients may have 1 support person aged 18 or older with them. For treatment visits, patients cannot have anyone with them due to current Covid guidelines and our immunocompromised population.  ? ?Iron Sucrose Injection ?What is this medication? ?IRON SUCROSE (EYE ern SOO krose) treats low levels of iron (iron deficiency anemia) in people with kidney disease. Iron is a mineral that plays an important role in making red blood cells, which carry oxygen from your lungs to the rest of your body. ?This medicine   may be used for other purposes; ask your health care provider or pharmacist if you have questions. ?COMMON BRAND NAME(S): Venofer ?What should I tell my care team before I take this medication? ?They need to know if you have any of these conditions: ?Anemia not caused by low iron levels ?Heart disease ?High levels of  iron in the blood ?Kidney disease ?Liver disease ?An unusual or allergic reaction to iron, other medications, foods, dyes, or preservatives ?Pregnant or trying to get pregnant ?Breast-feeding ?How should I use this medication? ?This medication is for infusion into a vein. It is given in a hospital or clinic setting. ?Talk to your care team about the use of this medication in children. While this medication may be prescribed for children as young as 2 years for selected conditions, precautions do apply. ?Overdosage: If you think you have taken too much of this medicine contact a poison control center or emergency room at once. ?NOTE: This medicine is only for you. Do not share this medicine with others. ?What if I miss a dose? ?It is important not to miss your dose. Call your care team if you are unable to keep an appointment. ?What may interact with this medication? ?Do not take this medication with any of the following: ?Deferoxamine ?Dimercaprol ?Other iron products ?This medication may also interact with the following: ?Chloramphenicol ?Deferasirox ?This list may not describe all possible interactions. Give your health care provider a list of all the medicines, herbs, non-prescription drugs, or dietary supplements you use. Also tell them if you smoke, drink alcohol, or use illegal drugs. Some items may interact with your medicine. ?What should I watch for while using this medication? ?Visit your care team regularly. Tell your care team if your symptoms do not start to get better or if they get worse. You may need blood work done while you are taking this medication. ?You may need to follow a special diet. Talk to your care team. Foods that contain iron include: whole grains/cereals, dried fruits, beans, or peas, leafy green vegetables, and organ meats (liver, kidney). ?What side effects may I notice from receiving this medication? ?Side effects that you should report to your care team as soon as  possible: ?Allergic reactions--skin rash, itching, hives, swelling of the face, lips, tongue, or throat ?Low blood pressure--dizziness, feeling faint or lightheaded, blurry vision ?Shortness of breath ?Side effects that usually do not require medical attention (report to your care team if they continue or are bothersome): ?Flushing ?Headache ?Joint pain ?Muscle pain ?Nausea ?Pain, redness, or irritation at injection site ?This list may not describe all possible side effects. Call your doctor for medical advice about side effects. You may report side effects to FDA at 1-800-FDA-1088. ?Where should I keep my medication? ?This medication is given in a hospital or clinic and will not be stored at home. ?NOTE: This sheet is a summary. It may not cover all possible information. If you have questions about this medicine, talk to your doctor, pharmacist, or health care provider. ?? 2023 Elsevier/Gold Standard (2021-02-22 00:00:00) ? ?

## 2022-03-05 NOTE — Progress Notes (Signed)
[facility] NOTE  Patient Care Team: Venetia Maxon, Rolanda Jay, PA-C as PCP - General (Family Medicine) Cammie Sickle, MD as Consulting Physician (Oncology)  CHIEF COMPLAINTS/PURPOSE OF CONSULTATION: ANEMIA   HEMATOLOGY HISTORY  # SEVERE IRON DEF-ANEMIA [FEB, 2023-[facility]- Hb 10; MCV-70; ferrtin-2]; on Venofer  #Menorrhagia -April, 2023; prior [facility] [IUD- cannot; BCP; ? TAH]; awaiting second opinion gyn  HISTORY OF PRESENTING ILLNESS: Alone.  Ambulating independently. Marilyn Stanton 36 y.o.  female pleasant patient  with iron deficiency anemia likely secondary menorrhagia is here for follow-up.  Patient notes her improvement of energy levels status post Venofer.   Complains of fatigue.   Blood in stools:none Blood in urine: none Difficulty swallowing: Change of bowel movement/constipation:none Prior blood transfusion: none Liver disease: none Alcohol: none Bariatric surgery:none   Vaginal bleeding: heavy; with gyn in  Prior evaluation with hematology: none Prior bone marrow biopsy: none Oral iron: dyspepsia/diarrhea-  Prior IV iron infusions:none   Review of Systems  Constitutional:  Positive for malaise/fatigue. Negative for chills, diaphoresis, fever and weight loss.  HENT:  Negative for nosebleeds and sore throat.   Eyes:  Negative for double vision.  Respiratory:  Negative for cough, hemoptysis, sputum production, shortness of breath and wheezing.   Cardiovascular:  Negative for chest pain, palpitations, orthopnea and leg swelling.  Gastrointestinal:  Negative for abdominal pain, blood in stool, constipation, diarrhea, heartburn, melena, nausea and vomiting.  Genitourinary:  Negative for dysuria, frequency and urgency.  Musculoskeletal:  Negative for back pain and joint pain.  Skin: Negative.  Negative for itching and rash.  Neurological:  Negative for dizziness, tingling, focal weakness, weakness and headaches.  Endo/Heme/Allergies:   Does not bruise/bleed easily.  Psychiatric/Behavioral:  Negative for depression. The patient is not nervous/anxious and does not have insomnia.     MEDICAL HISTORY:  Past Medical History:  Diagnosis Date   Anxiety    Asthma     SURGICAL HISTORY: Past Surgical History:  Procedure Laterality Date   APPENDECTOMY     CESAREAN SECTION     08/26/2013, 07/14/2010   TUBAL LIGATION      SOCIAL HISTORY: Social History   Socioeconomic History   Marital status: Single    Spouse name: Not on file   Number of children: Not on file   Years of education: Not on file   Highest education level: Not on file  Occupational History   Not on file  Tobacco Use   Smoking status: Former    Types: Cigarettes   Smokeless tobacco: Never  Vaping Use   Vaping Use: Never used  Substance and Sexual Activity   Alcohol use: Yes   Drug use: Not on file   Sexual activity: Not on file  Other Topics Concern   Not on file  Social History Narrative   Live in [place]; works at [employer]- IM; no smoking; no alcohol abuse.    Social Determinants of Health   Financial Resource Strain: Not on file  Food Insecurity: Not on file  Transportation Needs: Not on file  Physical Activity: Not on file  Stress: Not on file  Social Connections: Not on file  Intimate Partner Violence: Not on file    FAMILY HISTORY: Family History  Problem Relation Age of Onset   Kidney cancer Maternal Grandmother    Cervical cancer Paternal Grandmother     ALLERGIES:  is allergic to reglan [metoclopramide] and promethazine.  MEDICATIONS:  Current Outpatient Medications  Medication Sig Dispense Refill  citalopram (CELEXA) 20 MG tablet Take 1 tablet (20 mg total) by mouth once daily 30 tablet 11   pramipexole (MIRAPEX) 0.125 MG tablet Take 1 tablet (0.125 mg total) by mouth at bedtime 30 tablet 11   escitalopram (LEXAPRO) 10 MG tablet Take 1 tablet (10 mg total) by mouth once daily 30 tablet 2    norgestimate-ethinyl estradiol (ORTHO-CYCLEN) 0.25-35 MG-MCG tablet Take 1 tablet by mouth once daily 84 tablet 0   omeprazole (PRILOSEC) 40 MG capsule Take 1 capsule (40 mg total) by mouth once daily (Patient not taking: Reported on 03/05/2022) 30 capsule 11   No current facility-administered medications for this visit.     Marland Kitchen  PHYSICAL EXAMINATION:   Vitals:   03/05/22 1300  BP: 121/76  Pulse: 66  Resp: 16  Temp: 98.5 F (36.9 C)   Filed Weights   03/05/22 1300  Weight: 157 lb 3.2 oz (71.3 kg)    Physical Exam Vitals and nursing note reviewed.  HENT:     Head: Normocephalic and atraumatic.     Mouth/Throat:     Pharynx: Oropharynx is clear.  Eyes:     Extraocular Movements: Extraocular movements intact.     Pupils: Pupils are equal, round, and reactive to light.  Cardiovascular:     Rate and Rhythm: Normal rate and regular rhythm.  Pulmonary:     Comments: Decreased breath sounds bilaterally.  Abdominal:     Palpations: Abdomen is soft.  Musculoskeletal:        General: Normal range of motion.     Cervical back: Normal range of motion.  Skin:    General: Skin is warm.  Neurological:     General: No focal deficit present.     Mental Status: She is alert and oriented to person, place, and time.  Psychiatric:        Behavior: Behavior normal.        Judgment: Judgment normal.     LABORATORY DATA:  I have reviewed the data as listed Lab Results  Component Value Date   WBC 8.6 03/05/2022   HGB 13.6 03/05/2022   HCT 43.2 03/05/2022   MCV 80.0 03/05/2022   PLT 301 03/05/2022   Recent Labs    01/07/22 1100  NA 137  K 3.5  CL 103  CO2 23  GLUCOSE 105*  BUN 11  CREATININE 0.98  CALCIUM 9.0  GFRNONAA >60  PROT 8.3*  ALBUMIN 4.3  AST 22  ALT 29  ALKPHOS 60  BILITOT 0.9     No results found.  ASSESSMENT & PLAN:   Symptomatic anemia #Iron deficient anemia:[[facility]- Hb 10; MCV-70; ferrtin-2]; extremely fatigued. Poor tolerance to PO  iron/diarrhea.  Symptomatic improvement noted post iron infusion.  # Proceed with venofer today  #Etiology of iron deficiency: likely sec to menorrhagia [awaiting II opinion [place] side-Gyn- in July, 2023].  # DISPOSITION: # venofer today # follow up in 3 months - MD; labs- cbc/possible venofer-Dr.B    All questions were answered. The patient knows to call the clinic with any problems, questions or concerns.    Cammie Sickle, MD 03/10/2022 3:28 PM

## 2022-03-10 ENCOUNTER — Encounter: Payer: Self-pay | Admitting: Internal Medicine

## 2022-03-17 ENCOUNTER — Other Ambulatory Visit: Payer: Self-pay

## 2022-03-17 MED ORDER — PREDNISONE 20 MG PO TABS
ORAL_TABLET | ORAL | 0 refills | Status: DC
Start: 2022-03-17 — End: 2022-08-25
  Filled 2022-03-17: qty 5, 5d supply, fill #0

## 2022-03-17 MED ORDER — AZITHROMYCIN 250 MG PO TABS
ORAL_TABLET | ORAL | 0 refills | Status: DC
  Filled 2022-03-17: qty 6, 5d supply, fill #0

## 2022-04-06 ENCOUNTER — Other Ambulatory Visit: Payer: Self-pay

## 2022-04-17 ENCOUNTER — Encounter: Payer: Self-pay | Admitting: Obstetrics

## 2022-04-18 ENCOUNTER — Encounter: Payer: Self-pay | Admitting: Obstetrics

## 2022-05-26 ENCOUNTER — Ambulatory Visit: Payer: Self-pay | Admitting: Internal Medicine

## 2022-05-27 ENCOUNTER — Encounter: Payer: Self-pay | Admitting: Internal Medicine

## 2022-06-02 MED FILL — Iron Sucrose Inj 20 MG/ML (Fe Equiv): INTRAVENOUS | Qty: 10 | Status: AC

## 2022-06-03 ENCOUNTER — Inpatient Hospital Stay: Payer: Self-pay | Attending: Internal Medicine | Admitting: Internal Medicine

## 2022-06-03 ENCOUNTER — Inpatient Hospital Stay: Payer: Self-pay

## 2022-06-03 NOTE — Assessment & Plan Note (Deleted)
#  Iron deficient anemia:[[facility]- Hb 10; MCV-70; ferrtin-2]; extremely fatigued. Poor tolerance to PO iron/diarrhea.  Symptomatic improvement noted post iron infusion.  # Proceed with venofer today  #Etiology of iron deficiency: likely sec to menorrhagia [awaiting II opinion [place] side-Gyn- in July, 2023].  # DISPOSITION: # venofer today # follow up in 3 months - MD; labs- cbc/possible venofer-Dr.B

## 2022-06-03 NOTE — Progress Notes (Deleted)
[facility] NOTE  Patient Care Team: Venetia Maxon, Rolanda Jay, PA-C as PCP - General (Family Medicine) Cammie Sickle, MD as Consulting Physician (Oncology)  CHIEF COMPLAINTS/PURPOSE OF CONSULTATION: ANEMIA   HEMATOLOGY HISTORY  # SEVERE IRON DEF-ANEMIA [FEB, 2023-[facility]- Hb 10; MCV-70; ferrtin-2]; on Venofer  #Menorrhagia -April, 2023; prior [facility] [IUD- cannot; BCP; ? TAH]; awaiting second opinion gyn  HISTORY OF PRESENTING ILLNESS: Alone.  Ambulating independently. Marilyn Stanton 36 y.o.  female pleasant patient  with iron deficiency anemia likely secondary menorrhagia is here for follow-up.  Patient notes her improvement of energy levels status post Venofer.   Complains of fatigue.   Blood in stools:none Blood in urine: none Difficulty swallowing: Change of bowel movement/constipation:none Prior blood transfusion: none Liver disease: none Alcohol: none Bariatric surgery:none   Vaginal bleeding: heavy; with gyn in  Prior evaluation with hematology: none Prior bone marrow biopsy: none Oral iron: dyspepsia/diarrhea-  Prior IV iron infusions:none   Review of Systems  Constitutional:  Positive for malaise/fatigue. Negative for chills, diaphoresis, fever and weight loss.  HENT:  Negative for nosebleeds and sore throat.   Eyes:  Negative for double vision.  Respiratory:  Negative for cough, hemoptysis, sputum production, shortness of breath and wheezing.   Cardiovascular:  Negative for chest pain, palpitations, orthopnea and leg swelling.  Gastrointestinal:  Negative for abdominal pain, blood in stool, constipation, diarrhea, heartburn, melena, nausea and vomiting.  Genitourinary:  Negative for dysuria, frequency and urgency.  Musculoskeletal:  Negative for back pain and joint pain.  Skin: Negative.  Negative for itching and rash.  Neurological:  Negative for dizziness, tingling, focal weakness, weakness and headaches.  Endo/Heme/Allergies:   Does not bruise/bleed easily.  Psychiatric/Behavioral:  Negative for depression. The patient is not nervous/anxious and does not have insomnia.     MEDICAL HISTORY:  Past Medical History:  Diagnosis Date  . Anxiety   . Asthma     SURGICAL HISTORY: Past Surgical History:  Procedure Laterality Date  . APPENDECTOMY    . CESAREAN SECTION     08/26/2013, 07/14/2010  . TUBAL LIGATION      SOCIAL HISTORY: Social History   Socioeconomic History  . Marital status: Single    Spouse name: Not on file  . Number of children: Not on file  . Years of education: Not on file  . Highest education level: Not on file  Occupational History  . Not on file  Tobacco Use  . Smoking status: Former    Types: Cigarettes  . Smokeless tobacco: Never  Vaping Use  . Vaping Use: Never used  Substance and Sexual Activity  . Alcohol use: Yes  . Drug use: Not on file  . Sexual activity: Not on file  Other Topics Concern  . Not on file  Social History Narrative   Live in [place]; works at [employer]- IM; no smoking; no alcohol abuse.    Social Determinants of Health   Financial Resource Strain: Not on file  Food Insecurity: Not on file  Transportation Needs: Not on file  Physical Activity: Not on file  Stress: Not on file  Social Connections: Not on file  Intimate Partner Violence: Not on file    FAMILY HISTORY: Family History  Problem Relation Age of Onset  . Kidney cancer Maternal Grandmother   . Cervical cancer Paternal Grandmother     ALLERGIES:  is allergic to reglan [metoclopramide] and promethazine.  MEDICATIONS:  Current Outpatient Medications  Medication Sig Dispense Refill  .  azithromycin (ZITHROMAX) 250 MG tablet Take 2 tablets (551m) by mouth on Day 1. Take 1 tablet (2549m by mouth on Days 2-5. 6 tablet 0  . citalopram (CELEXA) 20 MG tablet Take 1 tablet (20 mg total) by mouth once daily 30 tablet 11  . escitalopram (LEXAPRO) 10 MG tablet Take 1 tablet (10 mg  total) by mouth once daily 30 tablet 2  . norgestimate-ethinyl estradiol (ORTHO-CYCLEN) 0.25-35 MG-MCG tablet Take 1 tablet by mouth once daily 84 tablet 0  . omeprazole (PRILOSEC) 40 MG capsule Take 1 capsule (40 mg total) by mouth once daily (Patient not taking: Reported on 03/05/2022) 30 capsule 11  . pramipexole (MIRAPEX) 0.125 MG tablet Take 1 tablet (0.125 mg total) by mouth at bedtime 30 tablet 11  . predniSONE (DELTASONE) 20 MG tablet Take 1 tablet (20 mg total) by mouth once daily 5 tablet 0   No current facility-administered medications for this visit.     . Marland KitchenPHYSICAL EXAMINATION:   There were no vitals filed for this visit.  There were no vitals filed for this visit.   Physical Exam Vitals and nursing note reviewed.  HENT:     Head: Normocephalic and atraumatic.     Mouth/Throat:     Pharynx: Oropharynx is clear.  Eyes:     Extraocular Movements: Extraocular movements intact.     Pupils: Pupils are equal, round, and reactive to light.  Cardiovascular:     Rate and Rhythm: Normal rate and regular rhythm.  Pulmonary:     Comments: Decreased breath sounds bilaterally.  Abdominal:     Palpations: Abdomen is soft.  Musculoskeletal:        General: Normal range of motion.     Cervical back: Normal range of motion.  Skin:    General: Skin is warm.  Neurological:     General: No focal deficit present.     Mental Status: She is alert and oriented to person, place, and time.  Psychiatric:        Behavior: Behavior normal.        Judgment: Judgment normal.     LABORATORY DATA:  I have reviewed the data as listed Lab Results  Component Value Date   WBC 8.6 03/05/2022   HGB 13.6 03/05/2022   HCT 43.2 03/05/2022   MCV 80.0 03/05/2022   PLT 301 03/05/2022   Recent Labs    01/07/22 1100  NA 137  K 3.5  CL 103  CO2 23  GLUCOSE 105*  BUN 11  CREATININE 0.98  CALCIUM 9.0  GFRNONAA >60  PROT 8.3*  ALBUMIN 4.3  AST 22  ALT 29  ALKPHOS 60  BILITOT 0.9       No results found.  ASSESSMENT & PLAN:   No problem-specific Assessment & Plan notes found for this encounter.    All questions were answered. The patient knows to call the clinic with any problems, questions or concerns.    GoCammie SickleMD 06/03/2022 8:41 AM

## 2022-06-10 ENCOUNTER — Telehealth: Payer: Self-pay | Admitting: Family

## 2022-06-10 ENCOUNTER — Encounter: Payer: Self-pay | Admitting: Internal Medicine

## 2022-06-10 ENCOUNTER — Other Ambulatory Visit: Payer: Self-pay

## 2022-06-10 DIAGNOSIS — R399 Unspecified symptoms and signs involving the genitourinary system: Secondary | ICD-10-CM

## 2022-06-10 MED ORDER — CEPHALEXIN 500 MG PO CAPS
500.0000 mg | ORAL_CAPSULE | Freq: Two times a day (BID) | ORAL | 0 refills | Status: DC
  Filled 2022-06-10: qty 14, 7d supply, fill #0

## 2022-06-10 NOTE — Progress Notes (Signed)

## 2022-06-11 ENCOUNTER — Other Ambulatory Visit: Payer: Self-pay

## 2022-08-05 ENCOUNTER — Other Ambulatory Visit: Payer: Self-pay

## 2022-08-05 ENCOUNTER — Telehealth: Payer: Self-pay | Admitting: Physician Assistant

## 2022-08-05 ENCOUNTER — Encounter: Payer: Self-pay | Admitting: Internal Medicine

## 2022-08-05 DIAGNOSIS — B3731 Acute candidiasis of vulva and vagina: Secondary | ICD-10-CM

## 2022-08-05 MED ORDER — FLUCONAZOLE 150 MG PO TABS
150.0000 mg | ORAL_TABLET | Freq: Once | ORAL | 0 refills | Status: AC
  Filled 2022-08-05: qty 1, 1d supply, fill #0

## 2022-08-05 NOTE — Progress Notes (Signed)

## 2022-08-05 NOTE — Progress Notes (Signed)
I have spent 5 minutes in review of e-visit questionnaire, review and updating patient chart, medical decision making and response to patient.   Nykia Turko Cody Asmaa Tirpak, PA-C    

## 2022-08-25 ENCOUNTER — Telehealth: Payer: Self-pay | Admitting: Physician Assistant

## 2022-08-25 ENCOUNTER — Encounter: Payer: Self-pay | Admitting: Internal Medicine

## 2022-08-25 ENCOUNTER — Other Ambulatory Visit: Payer: Self-pay

## 2022-08-25 DIAGNOSIS — K047 Periapical abscess without sinus: Secondary | ICD-10-CM

## 2022-08-25 MED ORDER — AMOXICILLIN 500 MG PO CAPS
500.0000 mg | ORAL_CAPSULE | Freq: Three times a day (TID) | ORAL | 0 refills | Status: AC
  Filled 2022-08-25: qty 30, 10d supply, fill #0

## 2022-08-25 NOTE — Progress Notes (Signed)
E-Visit for Dental Pain  We are sorry that you are not feeling well.  Here is how we plan to help!  Based on what you have shared with me in the questionnaire, it sounds like you have a dental infection  Amoxicillin 500mg  3 times per day for 10 days  It is imperative that you see a dentist within 10 days of this eVisit to determine the cause of the dental pain and be sure it is adequately treated  A toothache or tooth pain is caused when the nerve in the root of a tooth or surrounding a tooth is irritated. Dental (tooth) infection, decay, injury, or loss of a tooth are the most common causes of dental pain. Pain may also occur after an extraction (tooth is pulled out). Pain sometimes originates from other areas and radiates to the jaw, thus appearing to be tooth pain.Bacteria growing inside your mouth can contribute to gum disease and dental decay, both of which can cause pain. A toothache occurs from inflammation of the central portion of the tooth called pulp. The pulp contains nerve endings that are very sensitive to pain. Inflammation to the pulp or pulpitis may be caused by dental cavities, trauma, and infection.    HOME CARE:   For toothaches: Over-the-counter pain medications such as acetaminophen or ibuprofen may be used. Take these as directed on the package while you arrange for a dental appointment. Avoid very cold or hot foods, because they may make the pain worse. You may get relief from biting on a cotton ball soaked in oil of cloves. You can get oil of cloves at most drug stores.  For jaw pain:  Aspirin may be helpful for problems in the joint of the jaw in adults. If pain happens every time you open your mouth widely, the temporomandibular joint (TMJ) may be the source of the pain. Yawning or taking a large bite of food may worsen the pain. An appointment with your doctor or dentist will help you find the cause.     GET HELP RIGHT AWAY IF:  You have a high fever or  chills If you have had a recent head or face injury and develop headache, light headedness, nausea, vomiting, or other symptoms that concern you after an injury to your face or mouth, you could have a more serious injury in addition to your dental injury. A facial rash associated with a toothache: This condition may improve with medication. Contact your doctor for them to decide what is appropriate. Any jaw pain occurring with chest pain: Although jaw pain is most commonly caused by dental disease, it is sometimes referred pain from other areas. People with heart disease, especially people who have had stents placed, people with diabetes, or those who have had heart surgery may have jaw pain as a symptom of heart attack or angina. If your jaw or tooth pain is associated with lightheadedness, sweating, or shortness of breath, you should see a doctor as soon as possible. Trouble swallowing or excessive pain or bleeding from gums: If you have a history of a weakened immune system, diabetes, or steroid use, you may be more susceptible to infections. Infections can often be more severe and extensive or caused by unusual organisms. Dental and gum infections in people with these conditions may require more aggressive treatment. An abscess may need draining or IV antibiotics, for example.  MAKE SURE YOU   Understand these instructions. Will watch your condition. Will get help right away if you  are not doing well or get worse.  Thank you for choosing an e-visit.  Your e-visit answers were reviewed by a board certified advanced clinical practitioner to complete your personal care plan. Depending upon the condition, your plan could have included both over the counter or prescription medications.  Please review your pharmacy choice. Make sure the pharmacy is open so you can pick up prescription now. If there is a problem, you may contact your provider through MyChart messaging and have the prescription routed to  another pharmacy.  Your safety is important to us. If you have drug allergies check your prescription carefully.   For the next 24 hours you can use MyChart to ask questions about today's visit, request a non-urgent call back, or ask for a work or school excuse. You will get an email in the next two days asking about your experience. I hope that your e-visit has been valuable and will speed your recovery.  I have spent 5 minutes in review of e-visit questionnaire, review and updating patient chart, medical decision making and response to patient.   Omari Koslosky M Rodrickus Min, PA-C  

## 2022-11-13 ENCOUNTER — Telehealth: Payer: Self-pay | Admitting: Physician Assistant

## 2022-11-13 ENCOUNTER — Other Ambulatory Visit: Payer: Self-pay

## 2022-11-13 DIAGNOSIS — M5442 Lumbago with sciatica, left side: Secondary | ICD-10-CM

## 2022-11-13 MED ORDER — NAPROXEN 500 MG PO TABS
500.0000 mg | ORAL_TABLET | Freq: Two times a day (BID) | ORAL | 0 refills | Status: DC
  Filled 2022-11-13: qty 15, 8d supply, fill #0

## 2022-11-13 MED ORDER — BACLOFEN 10 MG PO TABS
10.0000 mg | ORAL_TABLET | Freq: Three times a day (TID) | ORAL | 0 refills | Status: DC
  Filled 2022-11-13: qty 15, 5d supply, fill #0

## 2022-11-13 NOTE — Progress Notes (Signed)
I have spent 5 minutes in review of e-visit questionnaire, review and updating patient chart, medical decision making and response to patient.   Harini Dearmond Cody Colinda Barth, PA-C    

## 2022-11-13 NOTE — Progress Notes (Signed)

## 2022-11-14 ENCOUNTER — Encounter: Payer: Self-pay | Admitting: Internal Medicine

## 2022-11-14 ENCOUNTER — Other Ambulatory Visit: Payer: Self-pay

## 2022-11-25 ENCOUNTER — Encounter: Payer: Self-pay | Admitting: Internal Medicine

## 2022-12-26 ENCOUNTER — Encounter: Payer: Self-pay | Admitting: Internal Medicine

## 2023-01-13 NOTE — H&P (Signed)
Marilyn Stanton is a 37 y.o. female here for Novasure endometrial ablation  .   Pt with continue menorrhagia since I saw her 18 months ago and I couldnt place an IUD due to pt discomfort . She has failed conservative tx  She has extremely low ferritin and has gotten  Embx- neg 7/23 Pap neg 7/22  S/p c/s x2 and svd x 1 Past Medical History:  has a past medical history of Anemia, Anxiety, Depression, Headache, and Restless leg syndrome.  Past Surgical History:  has a past surgical history that includes Appendectomy; Cesarean section; and Tubal ligation (2014). Family History: family history includes Alcohol abuse in her father, maternal grandfather, and paternal grandfather; Allergic rhinitis in her son; Anxiety in her mother; Asthma in her maternal grandmother and son; COPD in her maternal grandmother; Dementia in her maternal grandmother; Diabetes type II in her paternal grandmother; Epilepsy in her son; High blood pressure (Hypertension) in her maternal grandmother; Hyperlipidemia (Elevated cholesterol) in her maternal grandmother; No Known Problems in her brother, brother, brother, daughter, and daughter; Stroke in her maternal grandmother; Thyroid disease in her mother. Social History:  reports that she quit smoking about 15 years ago. Her smoking use included cigarettes. She started smoking about 20 years ago. She has a 1.3 pack-year smoking history. She has been exposed to tobacco smoke. She has never used smokeless tobacco. She reports current alcohol use. She reports that she does not use drugs. OB/GYN History:  OB History       Gravida  3   Para  3   Term  3   Preterm      AB      Living  3        SAB      IAB      Ectopic      Molar      Multiple      Live Births  3             Allergies: is allergic to metoclopramide hcl and promethazine. Medications:   Current Outpatient Medications:    citalopram (CELEXA) 20 MG tablet, Take 1 tablet (20 mg total) by mouth once  daily, Disp: 90 tablet, Rfl: 0   Review of Systems: General:                      No fatigue or weight loss Eyes:                           No vision changes Ears:                            No hearing difficulty Respiratory:                No cough or shortness of breath Pulmonary:                  No asthma or shortness of breath Cardiovascular:           No chest pain, palpitations, dyspnea on exertion Gastrointestinal:          No abdominal bloating, chronic diarrhea, constipations, masses, pain or hematochezia Genitourinary:             No hematuria, dysuria, abnormal vaginal discharge, pelvic pain, Menometrorrhagia Lymphatic:                   No  swollen lymph nodes Musculoskeletal:         No muscle weakness Neurologic:                  No extremity weakness, syncope, seizure disorder Psychiatric:                  No history of depression, delusions or suicidal/homicidal ideation      Exam:       Vitals:    01/14/23 @1104   BP: 117/79  Pulse: 71      Body mass index is 24.12 kg/m.   WDWN white/ female in NAD   Lungs: CTA  CV : RRR without murmur   Neck:  no thyromegaly Abdomen: soft , no mass, normal active bowel sounds,  non-tender, no rebound tenderness Pelvic: tanner stage 5 ,  External genitalia: vulva /labia no lesions Urethra: no prolapse Vagina: normal physiologic d/c adequate room for TVH/ LAVH if need be  Cervix: no lesions, no cervical motion tenderness   Uterus: normal size shape and contour, non-tender Adnexa: no mass,  non-tender   Rectovaginal:    Impression:    The primary encounter diagnosis was Menorrhagia with regular cycle. A diagnosis of Iron deficiency anemia due to chronic blood loss was also pertinent to this visit.       Plan:    Pt has elected for OR endometrial ablation . Declines LAVH  Procedure discussed with her . She is aware of the limitations( cavity must accommodate 2.5 cm opening ) Success of ablation reviewed   Risks of  surgery  see [place] notes

## 2023-01-22 ENCOUNTER — Other Ambulatory Visit: Payer: Self-pay

## 2023-01-22 ENCOUNTER — Encounter
Admission: RE | Admit: 2023-01-22 | Discharge: 2023-01-22 | Disposition: A | Discharge status: Home / Self Care | Payer: Medicaid Other | Source: Ambulatory Visit | Attending: Obstetrics and Gynecology | Admitting: Obstetrics and Gynecology

## 2023-01-22 DIAGNOSIS — Z01812 Encounter for preprocedural laboratory examination: Secondary | ICD-10-CM

## 2023-01-22 HISTORY — DX: Headache, unspecified: R51.9

## 2023-01-22 HISTORY — DX: Restless legs syndrome: G25.81

## 2023-01-22 HISTORY — DX: Excessive and frequent menstruation with regular cycle: N92.0

## 2023-01-22 HISTORY — DX: Depression, unspecified: F32.A

## 2023-01-22 HISTORY — DX: Iron deficiency anemia secondary to blood loss (chronic): D50.0

## 2023-01-22 NOTE — Patient Instructions (Addendum)
Your procedure is scheduled on: 01/30/23 - Friday Report to the Registration Desk on the 1st floor of the [facility]. To find out your arrival time, please call [phone] between 1PM - 3PM on: 01/29/23 - Thursday If your arrival time is 6:00 am, do not arrive before that time as the [facility] entrance doors do not open until 6:00 am.  REMEMBER: Instructions that are not followed completely may result in serious medical risk, up to and including death; or upon the discretion of your surgeon and anesthesiologist your surgery may need to be rescheduled.  Do not eat food after midnight the night before surgery.  No gum chewing or hard candies.  You may however, drink CLEAR liquids up to 2 hours before you are scheduled to arrive for your surgery. Do not drink anything within 2 hours of your scheduled arrival time.  Clear liquids include: - water  - apple juice without pulp - gatorade (not RED colors) - black coffee or tea (Do NOT add milk or creamers to the coffee or tea) Do NOT drink anything that is not on this list.  In addition, your doctor has ordered for you to drink the provided:  Ensure Pre-Surgery Clear Carbohydrate Drink  Drinking this carbohydrate drink up to two hours before surgery helps to reduce insulin resistance and improve patient outcomes. Please complete drinking 2 hours before scheduled arrival time.  One week prior to surgery: Stop on 01/23/23 any Anti-inflammatories (NSAIDS) such as Advil, Aleve, Ibuprofen, Motrin, Naproxen, Naprosyn and Aspirin based products such as Excedrin, Goody's Powder, BC Powder.  Stop ANY OVER THE COUNTER supplements until after surgery.  You may take Tylenol if needed for pain up until the day of surgery.   TAKE ONLY THESE MEDICATIONS THE MORNING OF SURGERY WITH A SIP OF WATER:  NONE  No Alcohol for 24 hours before or after surgery.  No Smoking including e-cigarettes for 24 hours before surgery.  No chewable tobacco  products for at least 6 hours before surgery.  No nicotine patches on the day of surgery.  Do not use any "recreational" drugs for at least a week (preferably 2 weeks) before your surgery.  Please be advised that the combination of cocaine and anesthesia may have negative outcomes, up to and including death. If you test positive for cocaine, your surgery will be cancelled.  On the morning of surgery brush your teeth with toothpaste and water, you may rinse your mouth with mouthwash if you wish. Do not swallow any toothpaste or mouthwash.  Do not wear jewelry, make-up, hairpins, clips or nail polish.  Do not wear lotions, powders, or perfumes.   Do not shave body hair from the neck down 48 hours before surgery.  Contact lenses, hearing aids and dentures may not be worn into surgery.  Do not bring valuables to the hospital. [facility] is not responsible for any missing/lost belongings or valuables.   Notify your doctor if there is any change in your medical condition (cold, fever, infection).  Wear comfortable clothing (specific to your surgery type) to the hospital.  After surgery, you can help prevent lung complications by doing breathing exercises.  Take deep breaths and cough every 1-2 hours. Your doctor may order a device called an Incentive Spirometer to help you take deep breaths. When coughing or sneezing, hold a pillow firmly against your incision with both hands. This is called "splinting." Doing this helps protect your incision. It also decreases belly discomfort.  If you are  being admitted to the hospital overnight, leave your suitcase in the car. After surgery it may be brought to your room.  In case of increased patient census, it may be necessary for you, the patient, to continue your postoperative care in the Same Day Surgery department.  If you are being discharged the day of surgery, you will not be allowed to drive home. You will need a responsible individual to  drive you home and stay with you for 24 hours after surgery.   If you are taking public transportation, you will need to have a responsible individual with you.  Please call the Pre-admissions Testing Dept. at [phone] if you have any questions about these instructions.  Surgery Visitation Policy:  Patients having surgery or a procedure may have two visitors.  Children under the age of 30 must have an adult with them who is not the patient.  Inpatient Visitation:    Visiting hours are 7 a.m. to 8 p.m. Up to four visitors are allowed at one time in a patient room. The visitors may rotate out with other people during the day.  One visitor age 79 or older may stay with the patient overnight and must be in the room by 8 p.m. How to Use an Incentive Spirometer  An incentive spirometer is a tool that measures how well you are filling your lungs with each breath. Learning to take long, deep breaths using this tool can help you keep your lungs clear and active. This may help to reverse or lessen your chance of developing breathing (pulmonary) problems, especially infection. You may be asked to use a spirometer: After a surgery. If you have a lung problem or a history of smoking. After a long period of time when you have been unable to move or be active. If the spirometer includes an indicator to show the highest number that you have reached, your health care provider or respiratory therapist will help you set a goal. Keep a log of your progress as told by your health care provider. What are the risks? Breathing too quickly may cause dizziness or cause you to pass out. Take your time so you do not get dizzy or light-headed. If you are in pain, you may need to take pain medicine before doing incentive spirometry. It is harder to take a deep breath if you are having pain. How to use your incentive spirometer  Sit up on the edge of your bed or on a chair. Hold the incentive spirometer so that  it is in an upright position. Before you use the spirometer, breathe out normally. Place the mouthpiece in your mouth. Make sure your lips are closed tightly around it. Breathe in slowly and as deeply as you can through your mouth, causing the piston or the ball to rise toward the top of the chamber. Hold your breath for 3-5 seconds, or for as long as possible. If the spirometer includes a coach indicator, use this to guide you in breathing. Slow down your breathing if the indicator goes above the marked areas. Remove the mouthpiece from your mouth and breathe out normally. The piston or ball will return to the bottom of the chamber. Rest for a few seconds, then repeat the steps 10 or more times. Take your time and take a few normal breaths between deep breaths so that you do not get dizzy or light-headed. Do this every 1-2 hours when you are awake. If the spirometer includes a goal marker to  show the highest number you have reached (best effort), use this as a goal to work toward during each repetition. After each set of 10 deep breaths, cough a few times. This will help to make sure that your lungs are clear. If you have an incision on your chest or abdomen from surgery, place a pillow or a rolled-up towel firmly against the incision when you cough. This can help to reduce pain while taking deep breaths and coughing. General tips When you are able to get out of bed: Walk around often. Continue to take deep breaths and cough in order to clear your lungs. Keep using the incentive spirometer until your health care provider says it is okay to stop using it. If you have been in the hospital, you may be told to keep using the spirometer at home. Contact a health care provider if: You are having difficulty using the spirometer. You have trouble using the spirometer as often as instructed. Your pain medicine is not giving enough relief for you to use the spirometer as told. You have a fever. Get  help right away if: You develop shortness of breath. You develop a cough with bloody mucus from the lungs. You have fluid or blood coming from an incision site after you cough. Summary An incentive spirometer is a tool that can help you learn to take long, deep breaths to keep your lungs clear and active. You may be asked to use a spirometer after a surgery, if you have a lung problem or a history of smoking, or if you have been inactive for a long period of time. Use your incentive spirometer as instructed every 1-2 hours while you are awake. If you have an incision on your chest or abdomen, place a pillow or a rolled-up towel firmly against your incision when you cough. This will help to reduce pain. Get help right away if you have shortness of breath, you cough up bloody mucus, or blood comes from your incision when you cough. This information is not intended to replace advice given to you by your health care provider. Make sure you discuss any questions you have with your health care provider. Document Revised: 12/19/2019 Document Reviewed: 12/19/2019 Elsevier Patient Education  2023 [employer]

## 2023-01-23 ENCOUNTER — Encounter
Admission: RE | Admit: 2023-01-23 | Discharge: 2023-01-23 | Disposition: A | Discharge status: Home / Self Care | Payer: Medicaid Other | Source: Ambulatory Visit | Attending: Obstetrics and Gynecology | Admitting: Obstetrics and Gynecology

## 2023-01-23 DIAGNOSIS — Z01812 Encounter for preprocedural laboratory examination: Secondary | ICD-10-CM | POA: Diagnosis present

## 2023-01-23 DIAGNOSIS — Z01818 Encounter for other preprocedural examination: Secondary | ICD-10-CM

## 2023-01-23 LAB — BASIC METABOLIC PANEL
Anion gap: 10 (ref 5–15)
BUN: 12 mg/dL (ref 6–20)
CO2: 26 mmol/L (ref 22–32)
Calcium: 9.2 mg/dL (ref 8.9–10.3)
Chloride: 99 mmol/L (ref 98–111)
Creatinine, Ser: 0.97 mg/dL (ref 0.44–1.00)
GFR, Estimated: >60 mL/min (ref >60)
Glucose, Bld: 91 mg/dL (ref 70–99)
Potassium: 3.5 mmol/L (ref 3.5–5.1)
Sodium: 135 mmol/L (ref 135–145)

## 2023-01-23 LAB — CBC
HCT: 41.8 % (ref 36.0–46.0)
Hemoglobin: 13.1 g/dL (ref 12.0–15.0)
MCH: 25.6 pg — ABNORMAL LOW (ref 26.0–34.0)
MCHC: 31.3 g/dL (ref 30.0–36.0)
MCV: 81.6 fL (ref 80.0–100.0)
Platelets: 307 10*3/uL (ref 150–400)
RBC: 5.12 MIL/uL — ABNORMAL HIGH (ref 3.87–5.11)
RDW: 14.1 % (ref 11.5–15.5)
WBC: 8.5 10*3/uL (ref 4.0–10.5)
nRBC: 0 % (ref 0.0–0.2)

## 2023-01-23 LAB — TYPE AND SCREEN
ABO/RH(D): O POS
Antibody Screen: NEGATIVE

## 2023-01-30 ENCOUNTER — Ambulatory Visit: Payer: Medicaid Other | Admitting: General Practice

## 2023-01-30 ENCOUNTER — Other Ambulatory Visit: Payer: Self-pay

## 2023-01-30 ENCOUNTER — Ambulatory Visit
Admission: RE | Admit: 2023-01-30 | Discharge: 2023-01-30 | Disposition: A | Discharge status: Home / Self Care | Payer: Medicaid Other | Attending: Obstetrics and Gynecology | Admitting: Obstetrics and Gynecology

## 2023-01-30 ENCOUNTER — Encounter: Payer: Self-pay | Admitting: Obstetrics and Gynecology

## 2023-01-30 ENCOUNTER — Encounter
Admission: RE | Disposition: A | Discharge status: Home / Self Care | Payer: Self-pay | Source: Home / Self Care | Attending: Obstetrics and Gynecology

## 2023-01-30 DIAGNOSIS — N920 Excessive and frequent menstruation with regular cycle: Secondary | ICD-10-CM | POA: Diagnosis present

## 2023-01-30 DIAGNOSIS — Z01812 Encounter for preprocedural laboratory examination: Secondary | ICD-10-CM

## 2023-01-30 DIAGNOSIS — Z01818 Encounter for other preprocedural examination: Secondary | ICD-10-CM

## 2023-01-30 HISTORY — PX: DILITATION & CURRETTAGE/HYSTROSCOPY WITH NOVASURE ABLATION: SHX5568

## 2023-01-30 LAB — ABO/RH: ABO/RH(D): O POS

## 2023-01-30 LAB — POCT PREGNANCY, URINE: Preg Test, Ur: NEGATIVE

## 2023-01-30 SURGERY — DILATATION & CURETTAGE/HYSTEROSCOPY WITH NOVASURE ABLATION
Anesthesia: General | Site: Uterus

## 2023-01-30 MED ORDER — MIDAZOLAM HCL 2 MG/2ML IJ SOLN
INTRAMUSCULAR | Status: DC | PRN
  Administered 2023-01-30: 2 mg via INTRAVENOUS

## 2023-01-30 MED ORDER — FENTANYL CITRATE (PF) 100 MCG/2ML IJ SOLN
INTRAMUSCULAR | Status: DC | PRN
  Administered 2023-01-30: 25 ug via INTRAVENOUS

## 2023-01-30 MED ORDER — MIDAZOLAM HCL 2 MG/2ML IJ SOLN
INTRAMUSCULAR | Status: AC
  Filled 2023-01-30: qty 2

## 2023-01-30 MED ORDER — CEFAZOLIN SODIUM-DEXTROSE 2-4 GM/100ML-% IV SOLN
INTRAVENOUS | Status: AC
  Filled 2023-01-30: qty 100

## 2023-01-30 MED ORDER — SODIUM CHLORIDE 0.9 % IR SOLN
Status: DC | PRN
  Administered 2023-01-30: 1300 mL

## 2023-01-30 MED ORDER — FENTANYL CITRATE (PF) 100 MCG/2ML IJ SOLN: 25.0000 ug | INTRAMUSCULAR | Status: DC | PRN

## 2023-01-30 MED ORDER — GABAPENTIN 300 MG PO CAPS
ORAL_CAPSULE | ORAL | Status: AC
  Filled 2023-01-30: qty 1

## 2023-01-30 MED ORDER — LACTATED RINGERS IV SOLN: INTRAVENOUS | Status: DC

## 2023-01-30 MED ORDER — PROPOFOL 10 MG/ML IV BOLUS
INTRAVENOUS | Status: AC
  Filled 2023-01-30: qty 20

## 2023-01-30 MED ORDER — POVIDONE-IODINE 10 % EX SWAB
2.0000 | Freq: Once | CUTANEOUS | Status: AC
  Administered 2023-01-30: 2 via TOPICAL

## 2023-01-30 MED ORDER — FENTANYL CITRATE (PF) 100 MCG/2ML IJ SOLN
INTRAMUSCULAR | Status: AC
  Filled 2023-01-30: qty 2

## 2023-01-30 MED ORDER — FAMOTIDINE 20 MG PO TABS
20.0000 mg | ORAL_TABLET | Freq: Once | ORAL | Status: AC
  Administered 2023-01-30: 20 mg via ORAL

## 2023-01-30 MED ORDER — CEFAZOLIN SODIUM-DEXTROSE 2-4 GM/100ML-% IV SOLN
2.0000 g | Freq: Once | INTRAVENOUS | Status: AC
  Administered 2023-01-30: 2 g via INTRAVENOUS

## 2023-01-30 MED ORDER — LIDOCAINE HCL (CARDIAC) PF 100 MG/5ML IV SOSY
PREFILLED_SYRINGE | INTRAVENOUS | Status: DC | PRN
  Administered 2023-01-30: 80 mg via INTRAVENOUS

## 2023-01-30 MED ORDER — 0.9 % SODIUM CHLORIDE (POUR BTL) OPTIME
TOPICAL | Status: DC | PRN
  Administered 2023-01-30: 500 mL

## 2023-01-30 MED ORDER — PROPOFOL 10 MG/ML IV BOLUS
INTRAVENOUS | Status: DC | PRN
  Administered 2023-01-30: 200 mg via INTRAVENOUS

## 2023-01-30 MED ORDER — EPHEDRINE SULFATE (PRESSORS) 50 MG/ML IJ SOLN
INTRAMUSCULAR | Status: DC | PRN
  Administered 2023-01-30: 10 mg via INTRAVENOUS

## 2023-01-30 MED ORDER — DEXAMETHASONE SODIUM PHOSPHATE 10 MG/ML IJ SOLN
INTRAMUSCULAR | Status: DC | PRN
  Administered 2023-01-30: 10 mg via INTRAVENOUS

## 2023-01-30 MED ORDER — SILVER NITRATE-POT NITRATE 75-25 % EX MISC
CUTANEOUS | Status: DC | PRN
  Administered 2023-01-30: 4

## 2023-01-30 MED ORDER — KETOROLAC TROMETHAMINE 30 MG/ML IJ SOLN
INTRAMUSCULAR | Status: DC | PRN
  Administered 2023-01-30: 30 mg via INTRAVENOUS

## 2023-01-30 MED ORDER — OXYCODONE HCL 5 MG PO TABS
5.0000 mg | ORAL_TABLET | Freq: Once | ORAL | Status: AC | PRN
  Administered 2023-01-30: 5 mg via ORAL

## 2023-01-30 MED ORDER — SEVOFLURANE IN SOLN
RESPIRATORY_TRACT | Status: AC
  Filled 2023-01-30: qty 250

## 2023-01-30 MED ORDER — ACETAMINOPHEN 500 MG PO TABS
1000.0000 mg | ORAL_TABLET | ORAL | Status: AC
  Administered 2023-01-30: 1000 mg via ORAL

## 2023-01-30 MED ORDER — CHLORHEXIDINE GLUCONATE 0.12 % MT SOLN
15.0000 mL | Freq: Once | OROMUCOSAL | Status: AC
  Administered 2023-01-30: 15 mL via OROMUCOSAL

## 2023-01-30 MED ORDER — ORAL CARE MOUTH RINSE: 15.0000 mL | Freq: Once | OROMUCOSAL | Status: AC

## 2023-01-30 MED ORDER — OXYCODONE HCL 5 MG/5ML PO SOLN: 5.0000 mg | Freq: Once | ORAL | Status: AC | PRN

## 2023-01-30 MED ORDER — CHLORHEXIDINE GLUCONATE 0.12 % MT SOLN
OROMUCOSAL | Status: AC
  Filled 2023-01-30: qty 15

## 2023-01-30 MED ORDER — OXYCODONE HCL 5 MG PO TABS
ORAL_TABLET | ORAL | Status: AC
  Filled 2023-01-30: qty 1

## 2023-01-30 MED ORDER — GABAPENTIN 300 MG PO CAPS
300.0000 mg | ORAL_CAPSULE | ORAL | Status: AC
  Administered 2023-01-30: 300 mg via ORAL

## 2023-01-30 MED ORDER — FAMOTIDINE 20 MG PO TABS
ORAL_TABLET | ORAL | Status: AC
  Filled 2023-01-30: qty 1

## 2023-01-30 MED ORDER — ACETAMINOPHEN 500 MG PO TABS
ORAL_TABLET | ORAL | Status: AC
  Filled 2023-01-30: qty 2

## 2023-01-30 MED ORDER — ONDANSETRON HCL 4 MG/2ML IJ SOLN
INTRAMUSCULAR | Status: DC | PRN
  Administered 2023-01-30: 4 mg via INTRAVENOUS

## 2023-01-30 SURGICAL SUPPLY — 27 items
ABLATOR SURESOUND NOVASURE (ABLATOR) IMPLANT
BAG PRESSURE INF REUSE 1000 (BAG) ×1 IMPLANT
DEVICE MYOSURE LITE (MISCELLANEOUS) IMPLANT
DEVICE MYOSURE REACH (MISCELLANEOUS) IMPLANT
DRSG TELFA 3X8 NADH STRL (GAUZE/BANDAGES/DRESSINGS) ×1 IMPLANT
GLOVE SURG SYN 8.0 (GLOVE) ×1 IMPLANT
GLOVE SURG SYN 8.0 PF PI (GLOVE) ×1 IMPLANT
GOWN STRL REUS W/ TWL LRG LVL3 (GOWN DISPOSABLE) ×1 IMPLANT
GOWN STRL REUS W/ TWL XL LVL3 (GOWN DISPOSABLE) ×1 IMPLANT
GOWN STRL REUS W/TWL LRG LVL3 (GOWN DISPOSABLE) ×1
GOWN STRL REUS W/TWL XL LVL3 (GOWN DISPOSABLE) ×1
IV NS 1000ML (IV SOLUTION) ×1
IV NS 1000ML BAXH (IV SOLUTION) ×1 IMPLANT
IV NS IRRIG 3000ML ARTHROMATIC (IV SOLUTION) ×1 IMPLANT
KIT PROCEDURE FLUENT (KITS) IMPLANT
KIT TURNOVER CYSTO (KITS) ×1 IMPLANT
MANIFOLD NEPTUNE II (INSTRUMENTS) ×1 IMPLANT
PACK DNC HYST (MISCELLANEOUS) IMPLANT
PAD OB MATERNITY 4.3X12.25 (PERSONAL CARE ITEMS) ×1 IMPLANT
PAD PREP 24X41 OB/GYN DISP (PERSONAL CARE ITEMS) ×1 IMPLANT
SCRUB CHG 4% DYNA-HEX 4OZ (MISCELLANEOUS) ×1 IMPLANT
SEAL ROD LENS SCOPE MYOSURE (ABLATOR) ×1 IMPLANT
SET CYSTO W/LG BORE CLAMP LF (SET/KITS/TRAYS/PACK) IMPLANT
TOWEL OR 17X26 4PK STRL BLUE (TOWEL DISPOSABLE) ×1 IMPLANT
TRAP FLUID SMOKE EVACUATOR (MISCELLANEOUS) ×1 IMPLANT
TUBING CONNECTING 10 (TUBING) IMPLANT
WATER STERILE IRR 500ML POUR (IV SOLUTION) ×1 IMPLANT

## 2023-01-30 NOTE — Discharge Instructions (Signed)

## 2023-01-30 NOTE — Transfer of Care (Signed)
Immediate Anesthesia Transfer of Care Note  Patient: Marilyn Stanton  Procedure(s) Performed: DILATATION & CURETTAGE/HYSTEROSCOPY WITH NOVASURE ABLATION (Uterus)  Patient Location: PACU  Anesthesia Type:General  Level of Consciousness: awake, alert , and oriented  Airway & Oxygen Therapy: Patient Spontanous Breathing  Post-op Assessment: Report given to RN and Post -op Vital signs reviewed and stable  Post vital signs: Reviewed and stable  Last Vitals:  Vitals Value Taken Time  BP 113/74 01/30/23 0854  Temp    Pulse 72 01/30/23 0856  Resp 12   SpO2 98 % 01/30/23 0856  Vitals shown include unvalidated device data.  Last Pain:  Vitals:   01/30/23 0628  TempSrc: Temporal  PainSc: 2          Complications: No notable events documented.

## 2023-01-30 NOTE — Progress Notes (Signed)
Pt here for h/s , Novasure ablation . Labs reviewed . All questions answered . Neg HCG . Proceed

## 2023-01-30 NOTE — Brief Op Note (Signed)
01/30/2023  8:40 AM  PATIENT:  Quintella Reichert  37 y.o. female  PRE-OPERATIVE DIAGNOSIS:  menorrhagia, anemia  POST-OPERATIVE DIAGNOSIS:  menorrhagia, anemia  PROCEDURE:  Procedure(s): DILATATION & CURETTAGE/HYSTEROSCOPY WITH NOVASURE ABLATION (N/A)  SURGEON:  Surgeon(s) and Role:    * Dawit Tankard, Ihor Austin, MD - Primary  PHYSICIAN ASSISTANT: cst  ASSISTANTS: none   ANESTHESIA:   general  EBL:  10 mL IOF 600 cc uo 300 cc  BLOOD ADMINISTERED:none  DRAINS: none   LOCAL MEDICATIONS USED:  NONE  SPECIMEN:  No Specimen  DISPOSITION OF SPECIMEN:  N/A  COUNTS:  YES  TOURNIQUET:  * No tourniquets in log *  DICTATION: .Other Dictation: Dictation Number verbal  PLAN OF CARE: Discharge to home after PACU  PATIENT DISPOSITION:  PACU - hemodynamically stable.   Delay start of Pharmacological VTE agent (>24hrs) due to surgical blood loss or risk of bleeding: not applicable

## 2023-01-30 NOTE — Anesthesia Postprocedure Evaluation (Signed)
Anesthesia Post Note  Patient: Marilyn Stanton  Procedure(s) Performed: DILATATION & CURETTAGE/HYSTEROSCOPY WITH NOVASURE ABLATION (Uterus)  Patient location during evaluation: PACU Anesthesia Type: General Level of consciousness: awake and alert Pain management: pain level controlled Vital Signs Assessment: post-procedure vital signs reviewed and stable Respiratory status: spontaneous breathing, nonlabored ventilation and respiratory function stable Cardiovascular status: blood pressure returned to baseline and stable Postop Assessment: no apparent nausea or vomiting Anesthetic complications: no   No notable events documented.   Last Vitals:  Vitals:   01/30/23 0915 01/30/23 0939  BP: 123/77 134/80  Pulse: 71 60  Resp: 19 16  Temp: 36.4 C 36.6 C  SpO2: 100% 100%    Last Pain:  Vitals:   01/30/23 0939  TempSrc: Temporal  PainSc: 0-No pain                 Foye Deer

## 2023-01-30 NOTE — Op Note (Unsigned)
NAMECING, [place] MEDICAL RECORD NO: [id number] ACCOUNT NO: [id number] DATE OF BIRTH: Feb 09, 1986 FACILITY: [facility] LOCATION: [facility] PHYSICIAN: Suzy Bouchard, MD  Operative Report   DATE OF PROCEDURE: 01/30/2023  PREOPERATIVE DIAGNOSES: 1.  Menorrhagia. 2.  Anemia.  POSTOPERATIVE DIAGNOSES: 1.  Menorrhagia. 2.  Anemia.  PROCEDURE:  NovaSure endometrial ablation with hysteroscopy.  SURGEON:  Suzy Bouchard, MD  ANESTHESIA:  General endotracheal anesthesia.  INDICATIONS:  37 year old gravida 3, para 3, patient with greater than 18 month history of menorrhagia with significant blood loss and anemia with extremely low ferritin.  Endometrial biopsy in the office negative.  The patient has failed conservative  treatment.  DESCRIPTION OF PROCEDURE:  After adequate general endotracheal anesthesia, the patient was placed in dorsal supine position with legs in the candy cane stirrups.  Timeout was performed.  Weighted speculum was placed in the posterior vaginal vault and the  anterior cervix was grasped with single tooth tenaculum.  Uterus was examined and noted to be anteflexed.  Cervix was dilated to #16 Hanks dilator without difficulty.  Uterine sound 10 cm.  Hysteroscope was advanced into the endometrial cavity and no  endometrial pathology was identified.  Fallopian tube, ostia were identified bilaterally.  Cervical length was estimated at 5 cm.  Hysteroscope was removed.  The endometrial ablator was then brought up to the operative field after dilating the cervix to  #17 Hanks dilator.  The ablator was placed into the endometrial cavity without difficulty.  The array was opened, cavity width measured 3.3 cm and based on the cavity length of 5 cm power setting was 91 watts.  Cavity assessment test was performed and  passed.  The ablation took place for 1 minute and 6 seconds.  Ablator was removed and a repeat hysteroscopy demonstrated a normal charring effect.  There  were no complications.  Silver nitrate was applied to the tenacula sites for small oozing.  Good  hemostasis noted at the end of the procedure, the patient did receive 30 mg intravenous Toradol at the end of the procedure.  ESTIMATED BLOOD LOSS:  10 mL.  INTRAOPERATIVE FLUIDS:  600 mL  URINE OUTPUT:  300 mL  DISPOSITION:  The patient was taken to recovery room in good condition.   PUS D: 01/30/2023 8:52:28 am T: 01/30/2023 11:50:00 am  JOB: [id number]/ [id number]

## 2023-01-30 NOTE — Anesthesia Preprocedure Evaluation (Signed)
Anesthesia Evaluation  Patient identified by MRN, date of birth, ID band Patient awake    Reviewed: Allergy & Precautions, NPO status , Patient's Chart, lab work & pertinent test results  History of Anesthesia Complications Negative for: history of anesthetic complications  Airway Mallampati: I  TM Distance: >3 FB Neck ROM: full    Dental  (+) Dental Advidsory Given, Teeth Intact   Pulmonary neg pulmonary ROS, neg shortness of breath, former smoker   Pulmonary exam normal        Cardiovascular (-) Past MI negative cardio ROS Normal cardiovascular exam(-) dysrhythmias (-) Valvular Problems/Murmurs     Neuro/Psych negative neurological ROS  negative psych ROS   GI/Hepatic negative GI ROS, Neg liver ROS,,,  Endo/Other  negative endocrine ROS    Renal/GU      Musculoskeletal   Abdominal   Peds  Hematology negative hematology ROS (+)   Anesthesia Other Findings Past Medical History: No date: Anxiety No date: Asthma No date: Depression No date: Headache No date: Iron deficiency anemia due to chronic blood loss No date: Menorrhagia with regular cycle No date: RLS (restless legs syndrome)  Past Surgical History: No date: APPENDECTOMY No date: CESAREAN SECTION     Comment:  08/26/2013, 07/14/2010 No date: TUBAL LIGATION  BMI    Body Mass Index: 24.21 kg/m      Reproductive/Obstetrics negative OB ROS                             Anesthesia Physical Anesthesia Plan  ASA: 1  Anesthesia Plan:    Post-op Pain Management:    Induction: Intravenous  PONV Risk Score and Plan: Ondansetron, Dexamethasone and Midazolam  Airway Management Planned: LMA  Additional Equipment:   Intra-op Plan:   Post-operative Plan: Extubation in OR  Informed Consent: I have reviewed the patients History and Physical, chart, labs and discussed the procedure including the risks, benefits and  alternatives for the proposed anesthesia with the patient or authorized representative who has indicated his/her understanding and acceptance.     Dental Advisory Given  Plan Discussed with: Anesthesiologist, CRNA and Surgeon  Anesthesia Plan Comments: (Patient consented for risks of anesthesia including but not limited to:  - adverse reactions to medications - damage to eyes, teeth, lips or other oral mucosa - nerve damage due to positioning  - sore throat or hoarseness - Damage to heart, brain, nerves, lungs, other parts of body or loss of life  Patient voiced understanding.)       Anesthesia Quick Evaluation

## 2023-01-31 ENCOUNTER — Encounter: Payer: Self-pay | Admitting: Obstetrics and Gynecology

## 2023-06-02 ENCOUNTER — Ambulatory Visit: Payer: Medicaid Other | Admitting: Internal Medicine

## 2023-06-30 ENCOUNTER — Other Ambulatory Visit: Payer: Self-pay

## 2023-06-30 ENCOUNTER — Telehealth: Payer: Medicaid Other | Admitting: Physician Assistant

## 2023-06-30 ENCOUNTER — Encounter: Payer: Self-pay | Admitting: Internal Medicine

## 2023-06-30 DIAGNOSIS — J069 Acute upper respiratory infection, unspecified: Secondary | ICD-10-CM | POA: Diagnosis not present

## 2023-06-30 MED ORDER — BENZONATATE 100 MG PO CAPS
100.0000 mg | ORAL_CAPSULE | Freq: Three times a day (TID) | ORAL | 0 refills | Status: DC | PRN
Start: 2023-06-30 — End: 2023-09-23
  Filled 2023-06-30: qty 30, 10d supply, fill #0

## 2023-06-30 NOTE — Progress Notes (Signed)
I have spent 5 minutes in review of e-visit questionnaire, review and updating patient chart, medical decision making and response to patient.   Mia Milan Cody Jacklynn Dehaas, PA-C    

## 2023-06-30 NOTE — Progress Notes (Signed)

## 2023-07-08 ENCOUNTER — Other Ambulatory Visit: Payer: Self-pay

## 2023-08-17 ENCOUNTER — Telehealth: Payer: Medicaid Other | Admitting: Physician Assistant

## 2023-08-17 ENCOUNTER — Other Ambulatory Visit: Payer: Self-pay

## 2023-08-17 DIAGNOSIS — K0889 Other specified disorders of teeth and supporting structures: Secondary | ICD-10-CM | POA: Diagnosis not present

## 2023-08-17 MED ORDER — CLINDAMYCIN HCL 300 MG PO CAPS
300.0000 mg | ORAL_CAPSULE | Freq: Three times a day (TID) | ORAL | 0 refills | Status: AC
  Filled 2023-08-17: qty 21, 7d supply, fill #0

## 2023-08-17 NOTE — Progress Notes (Addendum)
E-Visit for Dental Pain  We are sorry that you are not feeling well.  Here is how we plan to help!  Based on what you have shared with me in the questionnaire, it sounds like you have a possible dental infection.   Clindamycin 300mg  3 times a day for 7 days  It is imperative that you see a dentist within 10 days of this eVisit to determine the cause of the dental pain and be sure it is adequately treated  A toothache or tooth pain is caused when the nerve in the root of a tooth or surrounding a tooth is irritated. Dental (tooth) infection, decay, injury, or loss of a tooth are the most common causes of dental pain. Pain may also occur after an extraction (tooth is pulled out). Pain sometimes originates from other areas and radiates to the jaw, thus appearing to be tooth pain.Bacteria growing inside your mouth can contribute to gum disease and dental decay, both of which can cause pain. A toothache occurs from inflammation of the central portion of the tooth called pulp. The pulp contains nerve endings that are very sensitive to pain. Inflammation to the pulp or pulpitis may be caused by dental cavities, trauma, and infection.    HOME CARE:   For toothaches: Over-the-counter pain medications such as acetaminophen or ibuprofen may be used. Take these as directed on the package while you arrange for a dental appointment. Avoid very cold or hot foods, because they may make the pain worse. You may get relief from biting on a cotton ball soaked in oil of cloves. You can get oil of cloves at most drug stores.  For jaw pain:  Aspirin may be helpful for problems in the joint of the jaw in adults. If pain happens every time you open your mouth widely, the temporomandibular joint (TMJ) may be the source of the pain. Yawning or taking a large bite of food may worsen the pain. An appointment with your doctor or dentist will help you find the cause.     GET HELP RIGHT AWAY IF:  You have a high fever or  chills If you have had a recent head or face injury and develop headache, light headedness, nausea, vomiting, or other symptoms that concern you after an injury to your face or mouth, you could have a more serious injury in addition to your dental injury. A facial rash associated with a toothache: This condition may improve with medication. Contact your doctor for them to decide what is appropriate. Any jaw pain occurring with chest pain: Although jaw pain is most commonly caused by dental disease, it is sometimes referred pain from other areas. People with heart disease, especially people who have had stents placed, people with diabetes, or those who have had heart surgery may have jaw pain as a symptom of heart attack or angina. If your jaw or tooth pain is associated with lightheadedness, sweating, or shortness of breath, you should see a doctor as soon as possible. Trouble swallowing or excessive pain or bleeding from gums: If you have a history of a weakened immune system, diabetes, or steroid use, you may be more susceptible to infections. Infections can often be more severe and extensive or caused by unusual organisms. Dental and gum infections in people with these conditions may require more aggressive treatment. An abscess may need draining or IV antibiotics, for example.  MAKE SURE YOU   Understand these instructions. Will watch your condition. Will get help right away  if you are not doing well or get worse.  Thank you for choosing an e-visit.  Your e-visit answers were reviewed by a board certified advanced clinical practitioner to complete your personal care plan. Depending upon the condition, your plan could have included both over the counter or prescription medications.  Please review your pharmacy choice. Make sure the pharmacy is open so you can pick up prescription now. If there is a problem, you may contact your provider through [employer] and have the prescription routed to  another pharmacy.  Your safety is important to [place]. If you have drug allergies check your prescription carefully.   For the next 24 hours you can use MyChart to ask questions about today's visit, request a non-urgent call back, or ask for a work or school excuse. You will get an email in the next two days asking about your experience. I hope that your e-visit has been valuable and will speed your recovery.   I have spent 5 minutes in review of e-visit questionnaire, review and updating patient chart, medical decision making and response to patient.   Tylene Fantasia Ward, PA-C

## 2023-09-23 ENCOUNTER — Other Ambulatory Visit: Payer: Self-pay

## 2023-09-23 ENCOUNTER — Telehealth: Payer: Medicaid Other | Admitting: Physician Assistant

## 2023-09-23 DIAGNOSIS — N760 Acute vaginitis: Secondary | ICD-10-CM | POA: Diagnosis not present

## 2023-09-23 DIAGNOSIS — B9689 Other specified bacterial agents as the cause of diseases classified elsewhere: Secondary | ICD-10-CM | POA: Diagnosis not present

## 2023-09-23 MED ORDER — METRONIDAZOLE 0.75 % VA GEL
1.0000 | Freq: Every day | VAGINAL | 0 refills | Status: AC
Start: 2023-09-23 — End: 2023-09-28
  Filled 2023-09-23: qty 70, 5d supply, fill #0

## 2023-09-23 NOTE — Progress Notes (Signed)
I have spent 5 minutes in review of e-visit questionnaire, review and updating patient chart, medical decision making and response to patient.   Mia Milan Cody Jacklynn Dehaas, PA-C    

## 2023-09-23 NOTE — Progress Notes (Signed)

## 2023-11-11 ENCOUNTER — Other Ambulatory Visit: Payer: Self-pay

## 2023-11-11 ENCOUNTER — Encounter: Payer: Self-pay | Admitting: Internal Medicine

## 2023-11-11 MED ORDER — ALBUTEROL SULFATE HFA 108 (90 BASE) MCG/ACT IN AERS
2.0000 | INHALATION_SPRAY | Freq: Four times a day (QID) | RESPIRATORY_TRACT | 0 refills | Status: DC | PRN
  Filled 2023-11-11: qty 18, 25d supply, fill #0

## 2023-11-11 MED ORDER — BENZONATATE 200 MG PO CAPS
200.0000 mg | ORAL_CAPSULE | Freq: Three times a day (TID) | ORAL | 0 refills | Status: DC
  Filled 2023-11-11: qty 20, 7d supply, fill #0

## 2023-11-13 ENCOUNTER — Other Ambulatory Visit: Payer: Self-pay

## 2023-11-13 MED ORDER — PREDNISONE 10 MG PO TABS
10.0000 mg | ORAL_TABLET | Freq: Every day | ORAL | 0 refills | Status: DC
  Filled 2023-11-13: qty 10, 10d supply, fill #0

## 2023-11-16 ENCOUNTER — Other Ambulatory Visit: Payer: Self-pay

## 2023-11-16 ENCOUNTER — Encounter: Payer: Self-pay | Admitting: Internal Medicine

## 2023-11-16 MED ORDER — HYDROCODONE BIT-HOMATROP MBR 5-1.5 MG/5ML PO SOLN
5.0000 mL | Freq: Four times a day (QID) | ORAL | 0 refills | Status: DC | PRN
  Filled 2023-11-16: qty 100, 5d supply, fill #0

## 2023-11-16 MED ORDER — AZITHROMYCIN 250 MG PO TABS
ORAL_TABLET | ORAL | 0 refills | Status: DC
  Filled 2023-11-16: qty 6, 5d supply, fill #0

## 2024-02-15 ENCOUNTER — Telehealth: Admitting: Family Medicine

## 2024-02-15 ENCOUNTER — Other Ambulatory Visit: Payer: Self-pay

## 2024-02-15 DIAGNOSIS — M5442 Lumbago with sciatica, left side: Secondary | ICD-10-CM | POA: Diagnosis not present

## 2024-02-15 MED ORDER — NAPROXEN 500 MG PO TABS
500.0000 mg | ORAL_TABLET | Freq: Two times a day (BID) | ORAL | 0 refills | Status: DC
Start: 2024-02-15 — End: 2024-06-20
  Filled 2024-02-15: qty 30, 15d supply, fill #0

## 2024-02-15 MED ORDER — BACLOFEN 10 MG PO TABS
10.0000 mg | ORAL_TABLET | Freq: Three times a day (TID) | ORAL | 0 refills | Status: DC
Start: 2024-02-15 — End: 2024-06-20
  Filled 2024-02-15: qty 30, 10d supply, fill #0

## 2024-02-15 NOTE — Progress Notes (Signed)
 E-Visit for Back Pain   We are sorry that you are not feeling well.  Here is how we plan to help!  Based on what you have shared with me it looks like you mostly have acute back pain.  Acute back pain is defined as musculoskeletal pain that can resolve in 1-3 weeks with conservative treatment.  I have prescribed Naprosyn  500 mg take one by mouth twice a day non-steroid anti-inflammatory (NSAID) as well as Baclofen  10 mg every eight hours as needed which is a muscle relaxer  Some patients experience stomach irritation or in increased heartburn with anti-inflammatory drugs.  Please keep in mind that muscle relaxer's can cause fatigue and should not be taken while at work or driving.  Back pain is very common.  The pain often gets better over time.  The cause of back pain is usually not dangerous.  Most people can learn to manage their back pain on their own.  Home Care Stay active.  Start with short walks on flat ground if you can.  Try to walk farther each day. Do not sit, drive or stand in one place for more than 30 minutes.  Do not stay in bed. Do not avoid exercise or work.  Activity can help your back heal faster. Be careful when you bend or lift an object.  Bend at your knees, keep the object close to you, and do not twist. Sleep on a firm mattress.  Lie on your side, and bend your knees.  If you lie on your back, put a pillow under your knees. Only take medicines as told by your doctor. Put ice on the injured area. Put ice in a plastic bag Place a towel between your skin and the bag Leave the ice on for 15-20 minutes, 3-4 times a day for the first 2-3 days. 210 After that, you can switch between ice and heat packs. Ask your doctor about back exercises or massage. Avoid feeling anxious or stressed.  Find good ways to deal with stress, such as exercise.  Get Help Right Way If: Your pain does not go away with rest or medicine. Your pain does not go away in 1 week. You have new  problems. You do not feel well. The pain spreads into your legs. You cannot control when you poop (bowel movement) or pee (urinate) You feel sick to your stomach (nauseous) or throw up (vomit) You have belly (abdominal) pain. You feel like you may pass out (faint). If you develop a fever.  Make Sure you: Understand these instructions. Will watch your condition Will get help right away if you are not doing well or get worse.  Your e-visit answers were reviewed by a board certified advanced clinical practitioner to complete your personal care plan.  Depending on the condition, your plan could have included both over the counter or prescription medications.  If there is a problem please reply  once you have received a response from your provider.  Your safety is important to us .  If you have drug allergies check your prescription carefully.    You can use MyChart to ask questions about today's visit, request a non-urgent call back, or ask for a work or school excuse for 24 hours related to this e-Visit. If it has been greater than 24 hours you will need to follow up with your provider, or enter a new e-Visit to address those concerns.  You will get an e-mail in the next two days asking about  your experience.  I hope that your e-visit has been valuable and will speed your recovery. Thank you for using e-visits.  I provided 5 minutes of non face-to-face time during this encounter for chart review, medication and order placement, as well as and documentation.

## 2024-03-24 ENCOUNTER — Other Ambulatory Visit: Payer: Self-pay | Admitting: Nurse Practitioner

## 2024-03-24 DIAGNOSIS — R590 Localized enlarged lymph nodes: Secondary | ICD-10-CM

## 2024-03-25 ENCOUNTER — Ambulatory Visit
Admission: RE | Admit: 2024-03-25 | Discharge: 2024-03-25 | Disposition: A | Discharge status: Home / Self Care | Source: Ambulatory Visit | Attending: Nurse Practitioner | Admitting: Nurse Practitioner

## 2024-03-25 DIAGNOSIS — R590 Localized enlarged lymph nodes: Secondary | ICD-10-CM | POA: Diagnosis present

## 2024-04-01 ENCOUNTER — Other Ambulatory Visit: Payer: Self-pay | Admitting: Nurse Practitioner

## 2024-04-01 ENCOUNTER — Encounter: Payer: Self-pay | Admitting: Nurse Practitioner

## 2024-04-01 DIAGNOSIS — R599 Enlarged lymph nodes, unspecified: Secondary | ICD-10-CM

## 2024-04-01 DIAGNOSIS — D4989 Neoplasm of unspecified behavior of other specified sites: Secondary | ICD-10-CM

## 2024-04-01 NOTE — Progress Notes (Signed)
 Rosetta Cons, MD sent to [facility] PROCEDURE / BIOPSY REVIEW Date: 03/31/24  Requested Biopsy site: left neck Reason for request: lymphadenopathy Imaging review: Best seen on U/S  Decision: Approved Imaging modality to perform: Ultrasound Schedule with: No sedation / Local anesthetic Schedule for: Any VIR  Additional comments: @Schedulers .  Please contact me with questions, concerns, or if issue pertaining to this request arise.  Rosetta Cons, MD [facility]

## 2024-04-06 NOTE — Progress Notes (Signed)
 Patient for US  guided Core RT submandibular LN biopsy on Thurs 04/07/24, I called and spoke with the patient on the phone and gave pre-procedure instructions. Pt was made aware to be here at 1p and check in at the [facility] registration desk. Pt stated understanding.  Called  04/01/24

## 2024-04-07 ENCOUNTER — Ambulatory Visit
Admission: RE | Admit: 2024-04-07 | Discharge: 2024-04-07 | Disposition: A | Discharge status: Home / Self Care | Source: Ambulatory Visit | Attending: Nurse Practitioner | Admitting: Nurse Practitioner

## 2024-06-20 ENCOUNTER — Telehealth: Admitting: Physician Assistant

## 2024-06-20 DIAGNOSIS — B9789 Other viral agents as the cause of diseases classified elsewhere: Secondary | ICD-10-CM

## 2024-06-20 DIAGNOSIS — J019 Acute sinusitis, unspecified: Secondary | ICD-10-CM | POA: Diagnosis not present

## 2024-06-20 MED ORDER — FLUTICASONE PROPIONATE 50 MCG/ACT NA SUSP
2.0000 | Freq: Every day | NASAL | 0 refills | Status: DC
Start: 2024-06-20 — End: 2024-07-27

## 2024-06-20 MED ORDER — PREDNISONE 10 MG (21) PO TBPK
ORAL_TABLET | ORAL | 0 refills | Status: DC
Start: 2024-06-20 — End: 2024-07-31

## 2024-06-20 NOTE — Progress Notes (Signed)
 E-Visit for Sinus Problems  We are sorry that you are not feeling well.  Here is how we plan to help!  Based on what you have shared with me it looks like you have sinusitis.  Sinusitis is inflammation and infection in the sinus cavities of the head.  Based on your presentation I believe you most likely have Acute Viral Sinusitis.This is an infection most likely caused by a virus. There is not specific treatment for viral sinusitis other than to help you with the symptoms until the infection runs its course.  You may use an oral decongestant such as Mucinex D or if you have glaucoma or high blood pressure use plain Mucinex. Saline nasal spray help and can safely be used as often as needed for congestion, I have prescribed: Fluticasone  nasal spray two sprays in each nostril once a day.  I am prescribing a 6 day prednisone  taper.  Day 1: Take 2 tablets with breakfast, 1 tablet with lunch, 1 tablet with supper, and 2 tablets at bedtime Day 2: Take 2 tablets with breakfast, 1 tablet with lunch, 1 tablet with supper, and 1 tablet at bedtime Day 3: Take 1 tablet at breakfast, 1 tablet with lunch, 1 tablet with supper, and 1 tablet at bedtime Day 4: Take 1 tablet with breakfast, 1 tablet with supper and 1 tablet at bedtime Day 5: Take 1 tablet with breakfast and 1 tablet with supper or bedtime Day 6: Take 1 tablet with breakfast  Some authorities believe that zinc sprays or the use of Echinacea may shorten the course of your symptoms.  Sinus infections are not as easily transmitted as other respiratory infection, however we still recommend that you avoid close contact with loved ones, especially the very young and elderly.  Remember to wash your hands thoroughly throughout the day as this is the number one way to prevent the spread of infection!  Home Care: Only take medications as instructed by your medical team. Do not take these medications with alcohol. A steam or ultrasonic humidifier can help  congestion.  You can place a towel over your head and breathe in the steam from hot water coming from a faucet. Avoid close contacts especially the very young and the elderly. Cover your mouth when you cough or sneeze. Always remember to wash your hands.  Get Help Right Away If: You develop worsening fever or sinus pain. You develop a severe head ache or visual changes. Your symptoms persist after you have completed your treatment plan.  Make sure you Understand these instructions. Will watch your condition. Will get help right away if you are not doing well or get worse.   Thank you for choosing an e-visit.  Your e-visit answers were reviewed by a board certified advanced clinical practitioner to complete your personal care plan. Depending upon the condition, your plan could have included both over the counter or prescription medications.  Please review your pharmacy choice. Make sure the pharmacy is open so you can pick up prescription now. If there is a problem, you may contact your provider through [employer] and have the prescription routed to another pharmacy.  Your safety is important to us . If you have drug allergies check your prescription carefully.   For the next 24 hours you can use MyChart to ask questions about today's visit, request a non-urgent call back, or ask for a work or school excuse. You will get an email in the next two days asking about your experience. I  hope that your e-visit has been valuable and will speed your recovery.    I have spent 5 minutes in review of e-visit questionnaire, review and updating patient chart, medical decision making and response to patient.   Delon CHRISTELLA Dickinson, PA-C

## 2024-07-27 ENCOUNTER — Telehealth: Admitting: Family Medicine

## 2024-07-27 DIAGNOSIS — J069 Acute upper respiratory infection, unspecified: Secondary | ICD-10-CM | POA: Diagnosis not present

## 2024-07-27 MED ORDER — LIDOCAINE VISCOUS HCL 2 % MT SOLN
10.0000 mL | Freq: Four times a day (QID) | OROMUCOSAL | 0 refills | Status: AC | PRN
Start: 2024-07-27 — End: 2024-08-01

## 2024-07-27 MED ORDER — FLUTICASONE PROPIONATE 50 MCG/ACT NA SUSP: 2.0000 | Freq: Every day | NASAL | 0 refills | Status: DC

## 2024-07-27 NOTE — Progress Notes (Signed)

## 2024-07-31 ENCOUNTER — Telehealth: Admitting: Family

## 2024-07-31 DIAGNOSIS — J069 Acute upper respiratory infection, unspecified: Secondary | ICD-10-CM

## 2024-07-31 MED ORDER — PREDNISONE 10 MG (21) PO TBPK: ORAL_TABLET | ORAL | 0 refills | Status: DC

## 2024-07-31 MED ORDER — BENZONATATE 100 MG PO CAPS: 100.0000 mg | ORAL_CAPSULE | Freq: Three times a day (TID) | ORAL | 0 refills | Status: DC | PRN

## 2024-07-31 MED ORDER — CETIRIZINE HCL 10 MG PO TABS
10.0000 mg | ORAL_TABLET | Freq: Every day | ORAL | 11 refills | Status: AC
Start: 2024-07-31 — End: ?

## 2024-07-31 MED ORDER — FLUTICASONE PROPIONATE 50 MCG/ACT NA SUSP
2.0000 | Freq: Every day | NASAL | 6 refills | Status: AC
Start: 2024-07-31 — End: ?

## 2024-07-31 NOTE — Progress Notes (Signed)

## 2024-08-02 ENCOUNTER — Other Ambulatory Visit: Payer: Self-pay

## 2024-08-02 MED ORDER — PREDNISONE 10 MG PO TABS
10.0000 mg | ORAL_TABLET | Freq: Every day | ORAL | 0 refills | Status: DC
  Filled 2024-08-02: qty 10, 10d supply, fill #0

## 2024-08-05 ENCOUNTER — Other Ambulatory Visit: Payer: Self-pay

## 2024-08-05 MED ORDER — AMOXICILLIN-POT CLAVULANATE 875-125 MG PO TABS
1.0000 | ORAL_TABLET | Freq: Two times a day (BID) | ORAL | 0 refills | Status: AC
  Filled 2024-08-05: qty 14, 7d supply, fill #0

## 2024-08-17 ENCOUNTER — Other Ambulatory Visit: Payer: Self-pay

## 2024-10-27 ENCOUNTER — Telehealth: Admitting: Physician Assistant

## 2024-10-27 ENCOUNTER — Encounter: Payer: Self-pay | Admitting: Internal Medicine

## 2024-10-27 ENCOUNTER — Other Ambulatory Visit: Payer: Self-pay

## 2024-10-27 DIAGNOSIS — B9789 Other viral agents as the cause of diseases classified elsewhere: Secondary | ICD-10-CM

## 2024-10-27 DIAGNOSIS — J019 Acute sinusitis, unspecified: Secondary | ICD-10-CM

## 2024-10-27 MED ORDER — PREDNISONE 10 MG PO TABS
ORAL_TABLET | ORAL | 0 refills | Status: AC
  Filled 2024-10-27: qty 21, 6d supply, fill #0

## 2024-10-27 NOTE — Progress Notes (Signed)
 We are sorry that you are not feeling well.  Here is how we plan to help!  Based on what you have shared with me it looks like you have sinusitis.  Sinusitis is inflammation and infection in the sinus cavities of the head.  Based on your presentation I believe you most likely have Acute Viral Sinusitis.This is an infection most likely caused by a virus. There is not specific treatment for viral sinusitis other than to help you with the symptoms until the infection runs its course.  You may use an oral decongestant such as Mucinex D or if you have glaucoma or high blood pressure use plain Mucinex. Saline nasal spray help and can safely be used as often as needed for congestion, I have prescribed: Ipratropium Bromide nasal spray 0.03% 2 sprays in eah nostril 2-3 times a day.  I have also added on a prednisone pack to take as directed.  Some authorities believe that zinc sprays or the use of Echinacea may shorten the course of your symptoms.  Sinus infections are not as easily transmitted as other respiratory infection, however we still recommend that you avoid close contact with loved ones, especially the very young and elderly.  Remember to wash your hands thoroughly throughout the day as this is the number one way to prevent the spread of infection!  Home Care: Only take medications as instructed by your medical team. Do not take these medications with alcohol. A steam or ultrasonic humidifier can help congestion.  You can place a towel over your head and breathe in the steam from hot water coming from a faucet. Avoid close contacts especially the very young and the elderly. Cover your mouth when you cough or sneeze. Always remember to wash your hands.  Get Help Right Away If: You develop worsening fever or sinus pain. You develop a severe head ache or visual changes. Your symptoms persist after you have completed your treatment plan.  Make sure you Understand these instructions. Will watch  your condition. Will get help right away if you are not doing well or get worse.  Your e-visit answers were reviewed by a board certified advanced clinical practitioner to complete your personal care plan.  Depending on the condition, your plan could have included both over the counter or prescription medications.  If there is a problem please reply  once you have received a response from your provider.  Your safety is important to us .  If you have drug allergies check your prescription carefully.    You can use MyChart to ask questions about today's visit, request a non-urgent call back, or ask for a work or school excuse for 24 hours related to this e-Visit. If it has been greater than 24 hours you will need to follow up with your provider, or enter a new e-Visit to address those concerns.  You will get an e-mail in the next two days asking about your experience.  I hope that your e-visit has been valuable and will speed your recovery. Thank you for using e-visits.  I have spent 5 minutes in review of e-visit questionnaire, review and updating patient chart, medical decision making and response to patient.   Elsie Velma Lunger, PA-C
